# Patient Record
Sex: Male | Born: 1935 | Race: White | Hispanic: No | Marital: Married | Smoking: Never smoker
Health system: Southern US, Community
[De-identification: ages and names within clinical notes are randomized; demographics above are authoritative.]

## PROBLEM LIST (undated history)

## (undated) DIAGNOSIS — C61 Malignant neoplasm of prostate: Secondary | ICD-10-CM

## (undated) DIAGNOSIS — Z9889 Other specified postprocedural states: Secondary | ICD-10-CM

## (undated) DIAGNOSIS — H3530 Unspecified macular degeneration: Secondary | ICD-10-CM

## (undated) DIAGNOSIS — M479 Spondylosis, unspecified: Secondary | ICD-10-CM

## (undated) DIAGNOSIS — Z974 Presence of external hearing-aid: Secondary | ICD-10-CM

## (undated) DIAGNOSIS — Z9089 Acquired absence of other organs: Secondary | ICD-10-CM

## (undated) DIAGNOSIS — M316 Other giant cell arteritis: Secondary | ICD-10-CM

## (undated) DIAGNOSIS — M1990 Unspecified osteoarthritis, unspecified site: Secondary | ICD-10-CM

## (undated) HISTORY — DX: Unspecified osteoarthritis, unspecified site: M19.90

## (undated) HISTORY — DX: Other giant cell arteritis: M31.6

## (undated) HISTORY — DX: Malignant neoplasm of prostate: C61

## (undated) HISTORY — PX: TONSILLECTOMY: SUR1361

## (undated) HISTORY — DX: Presence of external hearing-aid: Z97.4

## (undated) HISTORY — DX: Other specified postprocedural states: Z98.890

## (undated) HISTORY — DX: Acquired absence of other organs: Z90.89

## (undated) HISTORY — DX: Spondylosis, unspecified: M47.9

## (undated) HISTORY — PX: OTHER SURGICAL HISTORY: SHX169

## (undated) HISTORY — DX: Unspecified macular degeneration: H35.30

---

## 2004-07-14 ENCOUNTER — Ambulatory Visit: Admission: RE | Admit: 2004-07-14 | Discharge: 2004-10-12 | Payer: Self-pay | Admitting: Radiation Oncology

## 2004-07-28 ENCOUNTER — Encounter: Admission: RE | Admit: 2004-07-28 | Discharge: 2004-07-28 | Payer: Self-pay | Admitting: Urology

## 2004-08-27 ENCOUNTER — Ambulatory Visit (HOSPITAL_BASED_OUTPATIENT_CLINIC_OR_DEPARTMENT_OTHER): Admission: RE | Admit: 2004-08-27 | Discharge: 2004-08-27 | Payer: Self-pay | Admitting: Urology

## 2004-08-27 ENCOUNTER — Ambulatory Visit (HOSPITAL_COMMUNITY): Admission: RE | Admit: 2004-08-27 | Discharge: 2004-08-27 | Payer: Self-pay | Admitting: Urology

## 2010-04-23 ENCOUNTER — Emergency Department (HOSPITAL_COMMUNITY): Admission: EM | Admit: 2010-04-23 | Discharge: 2010-04-24 | Payer: Self-pay | Admitting: Emergency Medicine

## 2010-05-22 ENCOUNTER — Encounter: Admission: RE | Admit: 2010-05-22 | Discharge: 2010-05-22 | Payer: Self-pay | Admitting: Neurology

## 2010-05-26 ENCOUNTER — Ambulatory Visit (HOSPITAL_COMMUNITY)
Admission: RE | Admit: 2010-05-26 | Discharge: 2010-05-26 | Payer: Self-pay | Source: Home / Self Care | Admitting: General Surgery

## 2010-11-06 LAB — CBC
HCT: 32.1 % — ABNORMAL LOW (ref 39.0–52.0)
Hemoglobin: 10.8 g/dL — ABNORMAL LOW (ref 13.0–17.0)
MCH: 30.2 pg (ref 26.0–34.0)
MCHC: 33.7 g/dL (ref 30.0–36.0)
MCV: 89.5 fL (ref 78.0–100.0)
Platelets: 476 10*3/uL — ABNORMAL HIGH (ref 150–400)
RBC: 3.59 MIL/uL — ABNORMAL LOW (ref 4.22–5.81)
RDW: 12.8 % (ref 11.5–15.5)
WBC: 10.6 10*3/uL — ABNORMAL HIGH (ref 4.0–10.5)

## 2010-11-06 LAB — SURGICAL PCR SCREEN
MRSA, PCR: NEGATIVE
Staphylococcus aureus: NEGATIVE

## 2010-11-07 LAB — POCT I-STAT, CHEM 8
BUN: 14 mg/dL (ref 6–23)
Calcium, Ion: 1.05 mmol/L — ABNORMAL LOW (ref 1.12–1.32)
Chloride: 100 mEq/L (ref 96–112)
Creatinine, Ser: 1.3 mg/dL (ref 0.4–1.5)
Glucose, Bld: 125 mg/dL — ABNORMAL HIGH (ref 70–99)
HCT: 38 % — ABNORMAL LOW (ref 39.0–52.0)
Hemoglobin: 12.9 g/dL — ABNORMAL LOW (ref 13.0–17.0)
Potassium: 3.9 mEq/L (ref 3.5–5.1)
Sodium: 137 mEq/L (ref 135–145)
TCO2: 31 mmol/L (ref 0–100)

## 2011-01-09 NOTE — H&P (Signed)
Huynh, Jeremiah NO.:  [id number]   MEDICAL RECORD NO.:  [id number]          PATIENT TYPE:  AMB   LOCATION:  [facility]                         FACILITY:  [facility]   PHYSICIAN:  Debroah Baller, M.D.     DATE OF BIRTH:  1935-10-23   DATE OF ADMISSION:  08/27/2004  DATE OF DISCHARGE:                                HISTORY & PHYSICAL   The patient is a 75 year old gentleman with a stage T1C adenocarcinoma of  the prostate with a Gleason PSA of 7.86.  The biopsy showed an  adenocarcinoma with a Gleason score of 6 (3+3) in 10% of the specimen  obtained from the left gland.  He underwent staging with bone scan and CT  scan which did not show any metastasis.  He has been presented various  treatment options and at this time has opted for definitive therapy with  prostate seed implantation.   PAST MEDICAL HISTORY:  Significant for hypercholesterolemia.   PAST SURGICAL HISTORY:  Tonsillectomy.   SOCIAL HISTORY:  He does not smoke.   FAMILY HISTORY:  There is no family history of prostate cancer.   ALLERGIES:  He has no known drug allergies.   MEDICATIONS:  He is taking Zocor, vitamin D, Viagra p.r.n. and __________  was started in November 2005.   REVIEW OF SYMPTOMS:  As listed in the previous chart.   PHYSICAL EXAMINATION:  GENERAL:  Well-developed, well-nourished man in no  acute distress.  LUNGS:  Clear to auscultation.  BACK:  Without CVA tenderness.  HEART:  Sounds regular rate and rhythm.  ABDOMEN:  Nontender, no inguinal hernias palpable.  GU:  The penis is uncircumcised without lesions neither along the shaft nor  urethra, both testes descended without masses.  No external scrotal lesions.  RECTAL:  Reveals a 30 g prostate, smooth with slight firmness in the center.  She does have external hemorrhoids.  EXTREMITIES:  Without calf tenderness.   LABORATORY DATA:  Pending at this time.   IMPRESSION:  Stage T1 to T2 prostate cancer.   PLAN:  The patient has  been presented treatment options and has opted for  definitive therapy with radioactive seed implantation.  Will go ahead and  get him prepared for this planned intervention.      RC/MEDQ  D:  08/26/2004  T:  08/26/2004  Job:  [id number]

## 2011-01-09 NOTE — Op Note (Signed)
[name], [name] NO.:  [id number]   MEDICAL RECORD NO.:  [id number]          PATIENT TYPE:  AMB   LOCATION:  [facility]                         FACILITY:  [facility]   PHYSICIAN:  Debroah Baller, M.D.     DATE OF BIRTH:  April 06, 1936   DATE OF PROCEDURE:  08/27/2004  DATE OF DISCHARGE:                                 OPERATIVE REPORT   PREOPERATIVE DIAGNOSIS:  Prostatic carcinoma.   POSTOPERATIVE DIAGNOSIS:  Prostatic carcinoma.   PROCEDURE:  Intraprostatic seed implantation of I 125 seeds via transrectal  ultrasound guidance into the prostate and cystoscopy.   SURGEON:  Debroah Baller, M.D.   Mammie LorenzoMolli Hazard A. Kathrynn Running, M.D.   ANESTHESIA:  General.   INDICATION FOR PROCEDURE:  The patient is a 75 year old white male with a  recent diagnosis of prostate cancer.  Presented his options, he has opted  for definitive therapy with intraprostatic seed implantation.  He is brought  in now for intervention.   PROCEDURE IN DETAIL:  The patient was brought into the operating room,  placed on the table in the supine position.  After adequate general  anesthesia was achieved, a Foley catheter was placed under sterile  conditions and the patient was placed in dorsal lithotomy using the  __________ stirrups.  The scrotum was shaved, prepped, and draped, taped up  onto the lower abdomen.  The perineum was then shaved and prepped and draped  for the performance of seed implantation.  A red rubber catheter was placed  into the rectum to release any excess air.  The transrectal ultrasound probe  was placed into the rectum and the prostate was scanned from apex to  seminale vesicles.  The previously-done ultrasound was compared to align the  images on the preplanning to the real-time prostatic ultrasound.  Once this  was done, then image capturing was performed by performing ultrasound and  placing these images into the computer program.  Once the images were fed  into the  program, then the previously-planned dosimetry study was overlaid  onto the prostate.  Appropriate needle placement was then finalized for  thorough coverage of the prostate.  Once this was finalized, the implant  procedure was begun.  Anchor needles were placed into previously determined  area.  Then a dummy needle was placed and a dummy strand place to localize  the base.  This real-time ultrasound and fluoroscopic views were then  correlated and the base was marked.  Bladder base was demarcated with  contrast in the Foley balloon.  The seed implantation was the begun. The  needles were placed from anterior to posterior in the prostate to encompass  the prostate in totality.  No portion of the prostate was left untreated.  Correlation was again performed using ultrasound and fluoroscopy for maximum  seed placement.  Once the final seeds were placed with good distribution,  the procedure was terminated.  KUB x-ray was then performed.  Ultrasound and  Foley were removed.  The patient was re-prepped and draped and flexible  cystoscopy performed.  Anterior urethra was normal, prostatic urethra 3-4 cm  in length, and the bladder showed no intravesical seeds nor spacers.  The  bladder was filled, cystoscope removed, a 16 French Foley was then placed to  temporarily drain the  bladder.  A rectal vault examination was performed and no foreign bodies  were noted.  A B&O suppository was then placed for local analgesia.  The  patient had tolerated all this well.  He was then awakened and taken to the  recovery room in good condition.      RC/MEDQ  D:  08/27/2004  T:  08/27/2004  Job:  [id number]

## 2012-11-14 ENCOUNTER — Other Ambulatory Visit: Payer: Self-pay | Admitting: Neurology

## 2012-12-20 ENCOUNTER — Ambulatory Visit (INDEPENDENT_AMBULATORY_CARE_PROVIDER_SITE_OTHER): Payer: Medicare Other | Admitting: Neurology

## 2012-12-20 ENCOUNTER — Encounter: Payer: Self-pay | Admitting: Neurology

## 2012-12-20 VITALS — BP 110/71 | HR 66 | Ht 68.5 in | Wt 164.0 lb

## 2012-12-20 DIAGNOSIS — M316 Other giant cell arteritis: Secondary | ICD-10-CM

## 2012-12-20 HISTORY — DX: Other giant cell arteritis: M31.6

## 2012-12-20 MED ORDER — PREDNISONE 1 MG PO TABS: 1.0000 mg | ORAL_TABLET | Freq: Every day | ORAL | Status: DC

## 2012-12-20 NOTE — Progress Notes (Signed)
   Reason for visit: Temporal arteritis  [name] is an 77 y.o. male  History of present illness:  [name] is a 77 year old right-handed white male with a history of temporal arteritis. The patient is currently on 7.5 mg of prednisone, and he indicates that he is doing fairly well with this. The patient has been on this dose for about 6 months. The patient indicates that he does have some soreness or achiness of the muscle, but this has been a chronic issue and it has not changed whatsoever with the reduction in the prednisone dose that has occurred in the summer of 2013. The patient denies any headaches, or jaw claudication. The patient denies any new medical issues that have come up since last seen. The patient not having double vision or alteration in visual fields.  Past Medical History  Diagnosis Date  . Arthritis     temporal  . Spondylosis     cervical   . Degeneration macular   . Prostate cancer   . Hx of tonsillectomy   . Hx of biopsy     temporal artery  . Temporal arteritis 12/20/2012    Past Surgical History  Procedure Laterality Date  . Radium seed implants for prostate cancer    . Tonsillectomy      Family History  Problem Relation Age of Onset  . Stroke Sister   . Seizures Sister   . Heart disease Brother     Social history:  reports that he has never smoked. He does not have any smokeless tobacco history on file. He reports that he does not drink alcohol or use illicit drugs.  Allergies: No Known Allergies  Medications:  No current outpatient prescriptions on file prior to visit.   No current facility-administered medications on file prior to visit.    ROS:  Out of a complete 14 system review of symptoms, the patient complains only of the following symptoms, and all other reviewed systems are negative.  Fatigue Hearing loss Achy muscles  Blood pressure 110/71, pulse 66, height 5' 8.5" (1.74 m), weight 164 lb (74.39 kg).  Physical  Exam  General: The patient is alert and cooperative at the time of the examination.  Skin: No significant peripheral edema is noted.   Neurologic Exam  Cranial nerves: Facial symmetry is present. Speech is normal, no aphasia or dysarthria is noted. Extraocular movements are full. Visual fields are full.  Motor: The patient has good strength in all 4 extremities.  Coordination: The patient has good finger-nose-finger and heel-to-shin bilaterally.  Gait and station: The patient has a normal gait. Tandem gait is normal. Romberg is negative. No drift is seen.  Reflexes: Deep tendon reflexes are symmetric.   Assessment/Plan:  1. Temporal arteritis  The patient recently had a sed rate of 13 in February 2014. The patient has done fairly well on a relatively low dose of prednisone. The patient will be reduced to 6 mg daily. If he has increased symptoms that are persistent, he will contact our office. Otherwise, the patient will followup in 6 months. We will check another sedimentation rate in about 2 months.  Jeremiah Palau MD 12/20/2012 7:39 PM  [facility] [address] [place], [place] [address]  Phone [phone] Fax [phone]

## 2013-01-25 ENCOUNTER — Other Ambulatory Visit: Payer: Self-pay

## 2013-01-25 MED ORDER — PREDNISONE 5 MG PO TABS: 5.0000 mg | ORAL_TABLET | Freq: Every day | ORAL | Status: DC

## 2013-01-25 MED ORDER — PREDNISONE 1 MG PO TABS: 1.0000 mg | ORAL_TABLET | Freq: Every day | ORAL | Status: DC

## 2013-02-19 ENCOUNTER — Telehealth: Payer: Self-pay | Admitting: Neurology

## 2013-02-19 DIAGNOSIS — M316 Other giant cell arteritis: Secondary | ICD-10-CM

## 2013-02-19 NOTE — Telephone Encounter (Signed)
I called patient. He is to come in for a sedimentation rate check.

## 2013-02-19 NOTE — Telephone Encounter (Signed)
Message copied by Stephanie Acre on Sun Feb 19, 2013 12:49 PM ------      Message from: Stephanie Acre      Created: Tue Dec 20, 2012  9:41 AM       Recheck sedimentation rate ------

## 2013-02-20 ENCOUNTER — Other Ambulatory Visit: Payer: Self-pay | Admitting: Neurology

## 2013-02-20 LAB — SEDIMENTATION RATE: Sed Rate: 24 mm/hr (ref 0–30)

## 2013-02-21 ENCOUNTER — Telehealth: Payer: Self-pay

## 2013-02-21 NOTE — Telephone Encounter (Signed)
Message copied by Doree Barthel on Tue Feb 21, 2013  9:21 AM ------      Message from: Stephanie Acre      Created: Mon Feb 20, 2013  5:21 PM       Please call the patient. The blood work results are unremarkable. Thank you.            ----- Message -----         From: [facility] Lab Results In Interface         Sent: 02/20/2013   4:38 PM           To: York Spaniel, MD                   ------

## 2013-02-21 NOTE — Telephone Encounter (Signed)
Called to give lab results. No answer, will try later.

## 2013-02-22 ENCOUNTER — Telehealth: Payer: Self-pay | Admitting: *Deleted

## 2013-02-22 NOTE — Telephone Encounter (Signed)
Spoke to spouse, gave results. Spouse requesting to know if prednisone can be reduced.

## 2013-02-22 NOTE — Telephone Encounter (Signed)
I called patient. The patient indicates that he feels well, no headaches or malaise. At this point, he is on 6 mg of prednisone daily, okay to drop the dose to 5 mg daily.

## 2013-02-22 NOTE — Telephone Encounter (Signed)
Patient calling about results °

## 2013-04-28 ENCOUNTER — Telehealth: Payer: Self-pay | Admitting: Neurology

## 2013-04-28 MED ORDER — PREDNISONE 5 MG PO TABS: 10.0000 mg | ORAL_TABLET | Freq: Every day | ORAL | Status: DC

## 2013-04-28 NOTE — Telephone Encounter (Signed)
I called the patient. His sedimentation rate has gone up to 31, barely out of the normal range. The patient however, began having some neck discomfort. His doctor put him back up to 10 mg of prednisone, which is reasonable. The patient was on 6 mg daily. We will recheck a sedimentation rate in 2 months.

## 2013-04-28 NOTE — Telephone Encounter (Signed)
Spouse reports two weeks ago patient started having pain in neck again. Went to PCP, Sed Rate results were 31 today. Was told to start Prednisone 10mg  by PCP. Spouse wanted to inform Dr. Anne Hahn of Sed Rate results and see if he thinks Prednisone should be increased.

## 2013-06-21 ENCOUNTER — Encounter: Payer: Self-pay | Admitting: Neurology

## 2013-06-21 ENCOUNTER — Encounter (INDEPENDENT_AMBULATORY_CARE_PROVIDER_SITE_OTHER): Payer: Self-pay

## 2013-06-21 ENCOUNTER — Ambulatory Visit (INDEPENDENT_AMBULATORY_CARE_PROVIDER_SITE_OTHER): Payer: Medicare Other | Admitting: Neurology

## 2013-06-21 VITALS — BP 130/78 | HR 72 | Wt 166.0 lb

## 2013-06-21 DIAGNOSIS — M316 Other giant cell arteritis: Secondary | ICD-10-CM

## 2013-06-21 DIAGNOSIS — Z5181 Encounter for therapeutic drug level monitoring: Secondary | ICD-10-CM

## 2013-06-21 LAB — SEDIMENTATION RATE: Sed Rate: 30 mm/hr (ref 0–30)

## 2013-06-21 NOTE — Progress Notes (Signed)
Reason for visit: Temporal arteritis  [name] is an 77 y.o. male  History of present illness:  [name] is a 77 year old right-handed white male with a history of temporal arteritis. The patient was tapered down to 6 mg daily of prednisone, but the sedimentation rate began to climb to 31, slightly out of the normal range. The patient began having increased discomfort in the neck, primarily on the right. The patient was increased to 10 mg daily of prednisone, and this has helped the discomfort some. The patient denied any headache issues, or jaw discomfort with chewing. The patient has not had any visual field changes. The patient does have intermittent discomfort in the neck on the right side. The patient will note crepitus in the neck. The pain is intermittent in nature. The patient denies any pain going down into the arms. The patient denies any new medical issues that have come up since last seen.  Past Medical History  Diagnosis Date  . Arthritis     temporal  . Spondylosis     cervical   . Degeneration macular   . Prostate cancer   . Hx of tonsillectomy   . Hx of biopsy     temporal artery  . Temporal arteritis 12/20/2012    Past Surgical History  Procedure Laterality Date  . Radium seed implants for prostate cancer    . Tonsillectomy      Family History  Problem Relation Age of Onset  . Stroke Sister   . Seizures Sister   . Heart disease Brother     Social history:  reports that he has never smoked. He has never used smokeless tobacco. He reports that he does not drink alcohol or use illicit drugs.   No Known Allergies  Medications:  Current Outpatient Prescriptions on File Prior to Visit  Medication Sig Dispense Refill  . aspirin 81 MG tablet Take 81 mg by mouth every other day. One tablet      . B Complex Vitamins (B COMPLEX 50) TABS Take 1 tablet by mouth daily.      . Calcium Carbonate-Vitamin D (CALTRATE 600+D PO) Take by mouth. Twice daily, chew       . cholecalciferol (VITAMIN D) 1000 UNITS tablet Take 1,000 Units by mouth 2 (two) times daily.      . predniSONE (DELTASONE) 5 MG tablet Take 2 tablets (10 mg total) by mouth daily.  180 tablet  1   No current facility-administered medications on file prior to visit.    ROS:  Out of a complete 14 system review of symptoms, the patient complains only of the following symptoms, and all other reviewed systems are negative.  Fatigue Hearing loss Neck discomfort  Blood pressure 130/78, pulse 72, weight 166 lb (75.297 kg).  Physical Exam  General: The patient is alert and cooperative at the time of the examination.  Neuromuscular: The patient lacks only about 5 or 10 of full lateral rotation of the cervical spine bilaterally.  Skin: No significant peripheral edema is noted.   Neurologic Exam  Mental status: The patient is oriented x 3.  Cranial nerves: Facial symmetry is present. Speech is normal, no aphasia or dysarthria is noted. Extraocular movements are full. Visual fields are full.  Motor: The patient has good strength in all 4 extremities.  Sensory examination: Soft touch sensation on the face, arms, and legs are symmetric.  Coordination: The patient has good finger-nose-finger and heel-to-shin bilaterally.  Gait and station: The patient  has a normal gait. Tandem gait is normal. Romberg is negative. No drift is seen.  Reflexes: Deep tendon reflexes are symmetric.   Assessment/Plan:  1. Temporal arteritis  2. Probable cervical spondylosis  The neck discomfort likely is related to cervical spondylosis, not to temporal arteritis. The sedimentation rate was rising on the lower doses of prednisone, however. The patient will be sent for a sedimentation rate today. If this is well within the normal range, the prednisone dose may be lowered slightly. The patient will followup in 6 months.  Marlan Palau MD 06/21/2013 10:08 AM  [facility] [address] [place], [place] [address]  Phone [phone] Fax [phone]

## 2013-06-21 NOTE — Patient Instructions (Signed)
Temporal Arteritis Arteries are blood vessels that carry blood from the heart to all parts of the body. Temporal arteritis is a swelling (inflammation) of certain large arteries. This usually affects arteries in the head and neck area, including arteries in the area on the side of the head, between the ears and eyes (temples). The condition can be very painful. It also can cause serious problems, even blindness. Early diagnosis and treatment is very important. CAUSES  Temporal arteritis results from the body reacting to injury or infection (inflammation). This may occur when the body's immune system (which fights germs and disease) makes a mistake. It attacks its own arteries. No one knows why this happens. However, certain things (risk factors) make it more likely that a person will develop temporal arteritis. They include:  Age. Most people with temporal arteritis are older than 50. The average age is 70.  Sex. Three times more women than men develop the condition.  Race and ethnic background. Caucasians are more likely to have temporal arteritis than other races. So are people whose families came from [place], [place], [place] or [place]).  Having polymyalgia rheumatica (PMR). This condition causes stiffness and pain in the joints of the neck, shoulders and hips. About 15% of people with PMR also have temporal arteritis. SYMPTOMS  Not everyone with temporal arteritis has the same symptoms. Some people have just one symptom. Others may have several. The most common symptom is a new headache, often in the temple region. Symptoms may show up in other parts of the body too.   Symptoms affecting the head may include:  Temporal arteries that feel hard or swollen. It may hurt when the temples are touched.  Pain when combing your hair, or when laying your head on a pillow.  Pain in the jaw when chewing.  Pain in the throat or tongue.  Visual problems, including sudden loss of  vision in one eye, or seeing double.  Symptoms in other parts of the body may include:  Fever.  Fatigue.  A dry cough.  Pain in the hips and shoulders.  Pain in the arms during exercise.  Depression.  Weight loss. DIAGNOSIS  Symptoms of temporal arteritis are similar to symptoms for other conditions. This can make it hard to tell if you have the condition. To be sure, your caregiver will ask about your symptoms and do a physical exam. Certain tests may be necessary, such as:   An exam of your temples. Often, the temporal arteries will be swollen and hard. This can be felt.  A complete blood count. This test shows how many red blood cells are in your blood. Most people with temporal arteritis do not have enough red blood cells (anemic).  Erythrocyte sedimentation (also called sed rate test). It measures inflammation in the body. Almost everyone with temporal arteritis has a high sed rate.  C reactive protein test. This also shows if there is inflammation.  Biopsy a temporal artery. This means the caregiver will take out a small piece of an artery. Then, it is checked under a microscope for inflammation. More than one biopsy may be needed. That is because inflammation can be in one part of an artery and not in others. Your caregiver may need to check more than one spot. TREATMENT  Starting treatment right away is very important. Often, you will need to see a specialist in immunologic diseases (rheumatologist). Goals of treatment include protecting your eyesight. Once vision is gone, it might not come   back. The normal treatment is medication. It usually works well and quickly. Most people start getting better in a few days. Medication options include:  Corticosteroids. These are powerful drugs that fight inflammation. These drugs are most often used to treat temporal arteritis.  Usually, a high dose is taken at first. After symptoms improve, a smaller dose is used. The goal is to take  the smallest dose possible and still control your symptoms. That is because using corticosteroids for a long time can cause problems. They can make muscles and bones weak. They can cause blood pressure to go up, and cause diabetes. Also, people often gain weight when they take corticosteroids. Corticosteroids may need to be taken for one or two years.  Several newer drugs are being tested to treat temporal arteritis. Researchers are testing to see if new drugs will work as well as corticosteroids, but cause fewer problems than them. Testing of these drugs is not yet complete.  Some specialists recommend low dose aspirin to prevent blood clots. HOME CARE INSTRUCTIONS   Take any corticosteroids that your caregiver prescribes. Follow the directions carefully.  Take any vitamins or supplements that your caregiver suggests. This may include vitamin D and calcium. They help keep your bones from becoming weak.  Keep all appointments for checkups. Your caregiver will watch for any problems from the medication. Checkups may include:  Periodic blood tests.  Bone density testing. This checks how strong or weak your bones are.  Blood pressure checks. If your blood pressure rises, you may need to take a drug to control it while you are taking corticosteroids.  Blood sugar checks. This is to be sure you are not developing diabetes. If you have diabetes, corticosteroid medications may make it worse and require increased treatment.  Exercise. First, talk with your caregiver about what would be OK for you to do. Aerobic exercise (which increases your heart rate) is usually suggested. It does not have to require a lot of energy. Walking is aerobic exercise. This type of exercise is good because it helps prevent bone loss. It also helps control your blood pressure.  Follow a healthy diet. The goal is to prevent bone damage and diabetes. Include good sources of protein in your diet. Also, include fruits,  vegetables and whole grains. Your caregiver can refer you to an expert on healthy eating (dietitian) for more advice. SEEK MEDICAL CARE IF:   The symptoms that lead to your diagnosis return.  You develop worsening fever, fatigue, headache, weight loss, or pain in your jaw.  You develop signs of infection. Infections can be worse if you are on corticosteroid medication. SEEK IMMEDIATE MEDICAL CARE IF:   Your eyesight changes.  Pain does not go away, even after taking pain medicine.  You feel pain in your chest.  Breathing is difficult.  One side of your face or body suddenly becomes weak or numb.  You develop a fever of more than 102 F (38.9 C). Document Released: 06/07/2009 Document Revised: 11/02/2011 Document Reviewed: 06/07/2009 [employer] Patient Information 2014 [employer].  

## 2013-06-22 NOTE — Progress Notes (Signed)
Quick Note:  Shared unremarkable results with wife(Sylvia), she understood ______

## 2013-10-04 ENCOUNTER — Telehealth: Payer: Self-pay | Admitting: Neurology

## 2013-10-04 DIAGNOSIS — Z5181 Encounter for therapeutic drug level monitoring: Secondary | ICD-10-CM

## 2013-10-04 NOTE — Telephone Encounter (Signed)
I called patient. The last sedimentation rate was done in October 2014. We will check one in March before his April appointment. I called and talked with the wife.

## 2013-10-04 NOTE — Telephone Encounter (Signed)
Pt's wife called and asked if the patient will need to have his SED rate checked before their appointment on 12-20-13.  Please call.  Thank you

## 2013-10-04 NOTE — Addendum Note (Signed)
Addended by: Margette Fast on: 10/04/2013 06:22 PM   Modules accepted: Orders

## 2013-10-23 ENCOUNTER — Encounter (INDEPENDENT_AMBULATORY_CARE_PROVIDER_SITE_OTHER): Payer: Self-pay

## 2013-10-23 ENCOUNTER — Other Ambulatory Visit (INDEPENDENT_AMBULATORY_CARE_PROVIDER_SITE_OTHER): Payer: Self-pay

## 2013-10-23 DIAGNOSIS — Z0289 Encounter for other administrative examinations: Secondary | ICD-10-CM

## 2013-10-23 DIAGNOSIS — Z5181 Encounter for therapeutic drug level monitoring: Secondary | ICD-10-CM

## 2013-10-24 LAB — SEDIMENTATION RATE: Sed Rate: 25 mm/hr (ref 0–30)

## 2013-10-24 NOTE — Progress Notes (Signed)
Quick Note:  Shared normal ESR per Dr Tobey Grim findings and patient verbalized understanding ______

## 2013-10-26 ENCOUNTER — Other Ambulatory Visit: Payer: Self-pay | Admitting: Neurology

## 2013-12-20 ENCOUNTER — Encounter: Payer: Self-pay | Admitting: Neurology

## 2013-12-20 ENCOUNTER — Ambulatory Visit (INDEPENDENT_AMBULATORY_CARE_PROVIDER_SITE_OTHER): Payer: Medicare Other | Admitting: Neurology

## 2013-12-20 VITALS — BP 122/78 | HR 60 | Wt 165.0 lb

## 2013-12-20 DIAGNOSIS — M316 Other giant cell arteritis: Secondary | ICD-10-CM

## 2013-12-20 MED ORDER — PREDNISONE 1 MG PO TABS: ORAL_TABLET | ORAL | Status: DC

## 2013-12-20 MED ORDER — PREDNISONE 5 MG PO TABS: 5.0000 mg | ORAL_TABLET | Freq: Every day | ORAL | Status: DC

## 2013-12-20 NOTE — Progress Notes (Signed)
PATIENT: [name] DOB: January 24, 1936  REASON FOR VISIT: follow up HISTORY FROM: patient  HISTORY OF PRESENT ILLNESS: [name] is a 78 year old right-handed white male with a history of temporal arteritis. He is returning today for follow-up. The patient is currently on 10 mg of prednisone, and he indicates that he is doing well with this. His last ESR was in March and it was 25. The patient's headaches and neck discomfort have resolved. He had injections in both eyes yesterday for macular degeneration. No new medical issues since last visit.    REVIEW OF SYSTEMS: Full 14 system review of systems performed and notable only for:  Constitutional: N/A  Eyes: loss of vision Ear/Nose/Throat: hearing loss Skin: N/A  Cardiovascular: N/A  Respiratory: N/A  Gastrointestinal: N/A  Genitourinary: N/A Hematology/Lymphatic: N/A  Endocrine: N/A Musculoskeletal:N/A  Allergy/Immunology: N/A  Neurological: N/A Psychiatric: N/A Sleep: N/A   ALLERGIES: No Known Allergies  HOME MEDICATIONS: Outpatient Prescriptions Prior to Visit  Medication Sig Dispense Refill  . aspirin 81 MG tablet Take 81 mg by mouth every other day. One tablet      . B Complex Vitamins (B COMPLEX 50) TABS Take 1 tablet by mouth daily.      . Calcium Carbonate-Vitamin D (CALTRATE 600+D PO) Take by mouth. Twice daily, chew      . cholecalciferol (VITAMIN D) 1000 UNITS tablet Take 1,000 Units by mouth 2 (two) times daily.      . predniSONE (DELTASONE) 5 MG tablet TAKE 2 TABLETS EVERY DAY  180 tablet  0   No facility-administered medications prior to visit.    PAST MEDICAL HISTORY: Past Medical History  Diagnosis Date  . Arthritis     temporal  . Spondylosis     cervical   . Degeneration macular   . Prostate cancer   . Hx of tonsillectomy   . Hx of biopsy     temporal artery  . Temporal arteritis 12/20/2012  . Hearing aid worn     Right    PAST SURGICAL HISTORY: Past Surgical History  Procedure  Laterality Date  . Radium seed implants for prostate cancer    . Tonsillectomy      FAMILY HISTORY: Family History  Problem Relation Age of Onset  . Stroke Sister   . Seizures Sister   . Heart disease Brother     SOCIAL HISTORY: History   Social History  . Marital Status: Married    Spouse Name: N/A    Number of Children: 2  . Years of Education: hs   Occupational History  . Not on file.   Social History Main Topics  . Smoking status: Never Smoker   . Smokeless tobacco: Never Used  . Alcohol Use: No  . Drug Use: No  . Sexual Activity: Not on file   Other Topics Concern  . Not on file   Social History Narrative  . No narrative on file      PHYSICAL EXAM  There were no vitals filed for this visit. There is no weight on file to calculate BMI.  Generalized: Well developed, in no acute distress  Head: normocephalic and atraumatic. No tenderness on scalp or temporal region.  Neurological examination  Mentation: Alert oriented to time, place, history taking. Follows all commands speech and language fluent Cranial nerve II-XII: Pupils were equal round reactive to light extraocular movements were full, visual field were full on confrontational test. Motor: The motor testing reveals 5 over 5 strength of  all 4 extremities. Good symmetric motor tone is noted throughout.  Coordination: Cerebellar testing reveals good finger-nose-finger and heel-to-shin bilaterally.  Gait and station: Gait is normal. Tandem gait is normal. Romberg is negative. No drift is seen.  Reflexes: Deep tendon reflexes are symmetric and normal bilaterally.   DIAGNOSTIC DATA (LABS, IMAGING, TESTING) - I reviewed patient records, labs, notes, testing and imaging myself where available.  Lab Results  Component Value Date   WBC 10.6* 05/22/2010   HGB 10.8* 05/22/2010   HCT 32.1* 05/22/2010   MCV 89.5 05/22/2010   PLT 476* 05/22/2010      Component Value Date/Time   NA 137 04/23/2010 1950   K 3.9  04/23/2010 1950   CL 100 04/23/2010 1950   GLUCOSE 125* 04/23/2010 1950   BUN 14 04/23/2010 1950   CREATININE 1.3 04/23/2010 1950     ASSESSMENT AND PLAN 78 y.o. year old male  has a past medical history of Arthritis; Spondylosis; Degeneration macular; Prostate cancer; tonsillectomy; biopsy; Temporal arteritis (12/20/2012); and Hearing aid worn. here with   1. Temporal arteritis  The patient's headaches and neck discomfort have resolved  Will decrease Prednisone to 9 mg for 6 weeks then 8 mg until his next visit. Follow up 3-4 months or sooner if needed.    Ward Givens, MSN, NP-C 12/20/2013, 9:58 AM [facility] [address] [place], [place] [address] [phone]  Note: This document was prepared with digital dictation and possible smart phrase technology. Any transcriptional errors that result from this process are unintentional. Kathrynn Ducking

## 2013-12-20 NOTE — Patient Instructions (Addendum)
Decrease prednisone to 9 mg for 6 weeks then 8 mg for 6 weeks. Temporal Arteritis Arteries are blood vessels that carry blood from the heart to all parts of the body. Temporal arteritis is a swelling (inflammation) of certain large arteries. This usually affects arteries in the head and neck area, including arteries in the area on the side of the head, between the ears and eyes (temples). The condition can be very painful. It also can cause serious problems, even blindness. Early diagnosis and treatment is very important. CAUSES  Temporal arteritis results from the body reacting to injury or infection (inflammation). This may occur when the body's immune system (which fights germs and disease) makes a mistake. It attacks its own arteries. No one knows why this happens. However, certain things (risk factors) make it more likely that a person will develop temporal arteritis. They include:  Age. Most people with temporal arteritis are older than 50. The average age is 13.  Sex. Three times more women than men develop the condition.  Race and ethnic background. Caucasians are more likely to have temporal arteritis than other races. So are people whose families came from [place] or [place]  Having polymyalgia rheumatica (PMR). This condition causes stiffness and pain in the joints of the neck, shoulders and hips. About 15% of people with PMR also have temporal arteritis. SYMPTOMS  Not everyone with temporal arteritis has the same symptoms. Some people have just one symptom. Others may have several. The most common symptom is a new headache, often in the temple region. Symptoms may show up in other parts of the body too.   Symptoms affecting the head may include:  Temporal arteries that feel hard or swollen. It may hurt when the temples are touched.  Pain when combing your hair, or when laying your head on a pillow.  Pain in the jaw when chewing.  Pain in the  throat or tongue.  Visual problems, including sudden loss of vision in one eye, or seeing double.  Symptoms in other parts of the body may include:  Fever.  Fatigue.  A dry cough.  Pain in the hips and shoulders.  Pain in the arms during exercise.  Depression.  Weight loss. DIAGNOSIS  Symptoms of temporal arteritis are similar to symptoms for other conditions. This can make it hard to tell if you have the condition. To be sure, your caregiver will ask about your symptoms and do a physical exam. Certain tests may be necessary, such as:   An exam of your temples. Often, the temporal arteries will be swollen and hard. This can be felt.  A complete blood count. This test shows how many red blood cells are in your blood. Most people with temporal arteritis do not have enough red blood cells (anemic).  Erythrocyte sedimentation (also called sed rate test). It measures inflammation in the body. Almost everyone with temporal arteritis has a high sed rate.  C reactive protein test. This also shows if there is inflammation.  Biopsy a temporal artery. This means the caregiver will take out a small piece of an artery. Then, it is checked under a microscope for inflammation. More than one biopsy may be needed. That is because inflammation can be in one part of an artery and not in others. Your caregiver may need to check more than one spot. TREATMENT  Starting treatment right away is very important. Often, you will need to see a specialist in immunologic diseases (rheumatologist). Goals  of treatment include protecting your eyesight. Once vision is gone, it might not come back. The normal treatment is medication. It usually works well and quickly. Most people start getting better in a few days. Medication options include:  Corticosteroids. These are powerful drugs that fight inflammation. These drugs are most often used to treat temporal arteritis.  Usually, a high dose is taken at first. After  symptoms improve, a smaller dose is used. The goal is to take the smallest dose possible and still control your symptoms. That is because using corticosteroids for a long time can cause problems. They can make muscles and bones weak. They can cause blood pressure to go up, and cause diabetes. Also, people often gain weight when they take corticosteroids. Corticosteroids may need to be taken for one or two years.  Several newer drugs are being tested to treat temporal arteritis. Researchers are testing to see if new drugs will work as well as corticosteroids, but cause fewer problems than them. Testing of these drugs is not yet complete.  Some specialists recommend low dose aspirin to prevent blood clots. HOME CARE INSTRUCTIONS   Take any corticosteroids that your caregiver prescribes. Follow the directions carefully.  Take any vitamins or supplements that your caregiver suggests. This may include vitamin D and calcium. They help keep your bones from becoming weak.  Keep all appointments for checkups. Your caregiver will watch for any problems from the medication. Checkups may include:  Periodic blood tests.  Bone density testing. This checks how strong or weak your bones are.  Blood pressure checks. If your blood pressure rises, you may need to take a drug to control it while you are taking corticosteroids.  Blood sugar checks. This is to be sure you are not developing diabetes. If you have diabetes, corticosteroid medications may make it worse and require increased treatment.  Exercise. First, talk with your caregiver about what would be OK for you to do. Aerobic exercise (which increases your heart rate) is usually suggested. It does not have to require a lot of energy. Walking is aerobic exercise. This type of exercise is good because it helps prevent bone loss. It also helps control your blood pressure.  Follow a healthy diet. The goal is to prevent bone damage and diabetes. Include good  sources of protein in your diet. Also, include fruits, vegetables and whole grains. Your caregiver can refer you to an expert on healthy eating (dietitian) for more advice. SEEK MEDICAL CARE IF:   The symptoms that lead to your diagnosis return.  You develop worsening fever, fatigue, headache, weight loss, or pain in your jaw.  You develop signs of infection. Infections can be worse if you are on corticosteroid medication. SEEK IMMEDIATE MEDICAL CARE IF:   Your eyesight changes.  Pain does not go away, even after taking pain medicine.  You feel pain in your chest.  Breathing is difficult.  One side of your face or body suddenly becomes weak or numb.  You develop a fever of more than 102 F (38.9 C). Document Released: 06/07/2009 Document Revised: 11/02/2011 Document Reviewed: 06/07/2009 [facility] Patient Information 2014 [place], [place].

## 2014-01-26 ENCOUNTER — Other Ambulatory Visit: Payer: Self-pay | Admitting: Neurology

## 2014-01-26 MED ORDER — PREDNISONE 1 MG PO TABS: ORAL_TABLET | ORAL | Status: DC

## 2014-01-26 NOTE — Telephone Encounter (Signed)
Patients dose has decreased per last OV: The patient's headaches and neck discomfort have resolved  Will decrease Prednisone to 9 mg for 6 weeks then 8 mg until his next visit.  Follow up 3-4 months or sooner if needed.

## 2014-03-19 ENCOUNTER — Telehealth: Payer: Self-pay | Admitting: Neurology

## 2014-03-19 DIAGNOSIS — M316 Other giant cell arteritis: Secondary | ICD-10-CM

## 2014-03-19 NOTE — Telephone Encounter (Signed)
Patient's wife is calling--states patient is trying to reduce the amount of Prednisone--he has been taking 8mg  for 6 to 7 weeks and wants to know if he should come in for a sed rate--please call patient and advise--thank you.

## 2014-03-19 NOTE — Telephone Encounter (Signed)
I called patient. I had a computer reminder to call them 2 days from now to get the sedimentation rate. I will go ahead and put the order in for this.

## 2014-03-20 ENCOUNTER — Other Ambulatory Visit (INDEPENDENT_AMBULATORY_CARE_PROVIDER_SITE_OTHER): Payer: Self-pay

## 2014-03-20 ENCOUNTER — Telehealth: Payer: Self-pay | Admitting: Neurology

## 2014-03-20 DIAGNOSIS — M316 Other giant cell arteritis: Secondary | ICD-10-CM

## 2014-03-20 DIAGNOSIS — Z0289 Encounter for other administrative examinations: Secondary | ICD-10-CM

## 2014-03-20 LAB — SEDIMENTATION RATE: Sed Rate: 6 mm/hr (ref 0–30)

## 2014-03-20 MED ORDER — PREDNISONE 1 MG PO TABS: ORAL_TABLET | ORAL | Status: DC

## 2014-03-20 NOTE — Telephone Encounter (Signed)
I called patient. The sedimentation rate was 6. The patient is on 8 mg daily of prednisone, we will drop the dose to 7 mg a day for one month, and then go to 6 mg daily. The patient has an appointment at the end of October 2015.

## 2014-05-09 ENCOUNTER — Ambulatory Visit: Payer: Medicare Other | Admitting: Neurology

## 2014-05-14 ENCOUNTER — Ambulatory Visit (INDEPENDENT_AMBULATORY_CARE_PROVIDER_SITE_OTHER): Payer: Medicare Other | Admitting: Neurology

## 2014-05-14 ENCOUNTER — Encounter: Payer: Self-pay | Admitting: Neurology

## 2014-05-14 VITALS — BP 131/77 | HR 74 | Wt 168.0 lb

## 2014-05-14 DIAGNOSIS — M316 Other giant cell arteritis: Secondary | ICD-10-CM

## 2014-05-14 MED ORDER — PREDNISONE 5 MG PO TABS: 5.0000 mg | ORAL_TABLET | Freq: Every day | ORAL | Status: DC

## 2014-05-14 MED ORDER — PREDNISONE 1 MG PO TABS: ORAL_TABLET | ORAL | Status: DC

## 2014-05-14 NOTE — Progress Notes (Signed)
Reason for visit: Temporal arteritis  [name] is an 78 y.o. male  History of present illness:  [name] is a 78 year old right-handed white male with a history of temporal arteritis. The patient has been on a tapering dose of prednisone, and he continues to do well, without headache or malaise. The patient is on 6 mg daily of prednisone currently. The patient had a recent sedimentation rate of 6. He denies any other significant medical issues, but he currently is followed for macular edema affecting both eyes, and he has required injections into the macula. The patient returns for an evaluation.  Past Medical History  Diagnosis Date  . Arthritis     temporal  . Spondylosis     cervical   . Degeneration macular   . Prostate cancer   . Hx of tonsillectomy   . Hx of biopsy     temporal artery  . Temporal arteritis 12/20/2012  . Hearing aid worn     Right    Past Surgical History  Procedure Laterality Date  . Radium seed implants for prostate cancer    . Tonsillectomy      Family History  Problem Relation Age of Onset  . Stroke Sister   . Seizures Sister   . Heart disease Brother     Social history:  reports that he has never smoked. He has never used smokeless tobacco. He reports that he does not drink alcohol or use illicit drugs.   No Known Allergies  Medications:  Current Outpatient Prescriptions on File Prior to Visit  Medication Sig Dispense Refill  . aspirin 81 MG tablet Take 81 mg by mouth every other day. One tablet      . B Complex Vitamins (B COMPLEX 50) TABS Take 1 tablet by mouth daily.      . Calcium Carbonate-Vitamin D (CALTRATE 600+D PO) Take by mouth. Twice daily, chew      . cholecalciferol (VITAMIN D) 1000 UNITS tablet Take 1,000 Units by mouth 2 (two) times daily.       No current facility-administered medications on file prior to visit.    ROS:  Out of a complete 14 system review of symptoms, the patient complains only of the  following symptoms, and all other reviewed systems are negative.  Hearing loss Visual loss  Blood pressure 131/77, pulse 74, weight 168 lb (76.204 kg).  Physical Exam  General: The patient is alert and cooperative at the time of the examination.  Skin: No significant peripheral edema is noted.   Neurologic Exam  Mental status: The patient is oriented x 3.  Cranial nerves: Facial symmetry is present. Speech is normal, no aphasia or dysarthria is noted. Extraocular movements are full. Visual fields are full. Pupils are equal, round, and reactive to light. Discs are flat bilaterally.  Motor: The patient has good strength in all 4 extremities.  Sensory examination: Soft touch sensation is symmetric on the face, arms, and legs.  Coordination: The patient has good finger-nose-finger and heel-to-shin bilaterally.  Gait and station: The patient has a normal gait. Tandem gait is normal. Romberg is negative. No drift is seen.  Reflexes: Deep tendon reflexes are symmetric.   Assessment/Plan:  1. Temporal arteritis  The patient continues to do well on the prednisone taper. We will drop down to 5 mg daily for 6 weeks, then go to 4 mg daily for 6 weeks, then we will check a sedimentation rate. The patient will followup in 6 months.  Prescriptions were given for the prednisone.  Jeremiah Alexanders MD 05/14/2014 7:23 PM  [facility] [address] [place], [place] [address]  Phone [phone] Fax [phone]

## 2014-05-14 NOTE — Patient Instructions (Signed)
Go to 5 mg daily of the prednisone for 6 weeks, then go to 4 mg daily for 6 weeks, then we will check a sedimentation rate ( blood work )  Temporal Arteritis Arteries are blood vessels that carry blood from the heart to all parts of the body. Temporal arteritis is a swelling (inflammation) of certain large arteries. This usually affects arteries in the head and neck area, including arteries in the area on the side of the head, between the ears and eyes (temples). The condition can be very painful. It also can cause serious problems, even blindness. Early diagnosis and treatment is very important. CAUSES  Temporal arteritis results from the body reacting to injury or infection (inflammation). This may occur when the body's immune system (which fights germs and disease) makes a mistake. It attacks its own arteries. No one knows why this happens. However, certain things (risk factors) make it more likely that a person will develop temporal arteritis. They include:  Age. Most people with temporal arteritis are older than 50. The average age is 58.  Sex. Three times more women than men develop the condition.  Race and ethnic background. Caucasians are more likely to have temporal arteritis than other races. So are people whose families came from [place], [place] or [place]  Having polymyalgia rheumatica (PMR). This condition causes stiffness and pain in the joints of the neck, shoulders and hips. About 15% of people with PMR also have temporal arteritis. SYMPTOMS  Not everyone with temporal arteritis has the same symptoms. Some people have just one symptom. Others may have several. The most common symptom is a new headache, often in the temple region. Symptoms may show up in other parts of the body too.   Symptoms affecting the head may include:  Temporal arteries that feel hard or swollen. It may hurt when the temples are touched.  Pain when combing your hair, or  when laying your head on a pillow.  Pain in the jaw when chewing.  Pain in the throat or tongue.  Visual problems, including sudden loss of vision in one eye, or seeing double.  Symptoms in other parts of the body may include:  Fever.  Fatigue.  A dry cough.  Pain in the hips and shoulders.  Pain in the arms during exercise.  Depression.  Weight loss. DIAGNOSIS  Symptoms of temporal arteritis are similar to symptoms for other conditions. This can make it hard to tell if you have the condition. To be sure, your caregiver will ask about your symptoms and do a physical exam. Certain tests may be necessary, such as:   An exam of your temples. Often, the temporal arteries will be swollen and hard. This can be felt.  A complete blood count. This test shows how many red blood cells are in your blood. Most people with temporal arteritis do not have enough red blood cells (anemic).  Erythrocyte sedimentation (also called sed rate test). It measures inflammation in the body. Almost everyone with temporal arteritis has a high sed rate.  C reactive protein test. This also shows if there is inflammation.  Biopsy a temporal artery. This means the caregiver will take out a small piece of an artery. Then, it is checked under a microscope for inflammation. More than one biopsy may be needed. That is because inflammation can be in one part of an artery and not in others. Your caregiver may need to check more than one spot. TREATMENT  Starting  treatment right away is very important. Often, you will need to see a specialist in immunologic diseases (rheumatologist). Goals of treatment include protecting your eyesight. Once vision is gone, it might not come back. The normal treatment is medication. It usually works well and quickly. Most people start getting better in a few days. Medication options include:  Corticosteroids. These are powerful drugs that fight inflammation. These drugs are most often  used to treat temporal arteritis.  Usually, a high dose is taken at first. After symptoms improve, a smaller dose is used. The goal is to take the smallest dose possible and still control your symptoms. That is because using corticosteroids for a long time can cause problems. They can make muscles and bones weak. They can cause blood pressure to go up, and cause diabetes. Also, people often gain weight when they take corticosteroids. Corticosteroids may need to be taken for one or two years.  Several newer drugs are being tested to treat temporal arteritis. Researchers are testing to see if new drugs will work as well as corticosteroids, but cause fewer problems than them. Testing of these drugs is not yet complete.  Some specialists recommend low dose aspirin to prevent blood clots. HOME CARE INSTRUCTIONS   Take any corticosteroids that your caregiver prescribes. Follow the directions carefully.  Take any vitamins or supplements that your caregiver suggests. This may include vitamin D and calcium. They help keep your bones from becoming weak.  Keep all appointments for checkups. Your caregiver will watch for any problems from the medication. Checkups may include:  Periodic blood tests.  Bone density testing. This checks how strong or weak your bones are.  Blood pressure checks. If your blood pressure rises, you may need to take a drug to control it while you are taking corticosteroids.  Blood sugar checks. This is to be sure you are not developing diabetes. If you have diabetes, corticosteroid medications may make it worse and require increased treatment.  Exercise. First, talk with your caregiver about what would be OK for you to do. Aerobic exercise (which increases your heart rate) is usually suggested. It does not have to require a lot of energy. Walking is aerobic exercise. This type of exercise is good because it helps prevent bone loss. It also helps control your blood  pressure.  Follow a healthy diet. The goal is to prevent bone damage and diabetes. Include good sources of protein in your diet. Also, include fruits, vegetables and whole grains. Your caregiver can refer you to an expert on healthy eating (dietitian) for more advice. SEEK MEDICAL CARE IF:   The symptoms that lead to your diagnosis return.  You develop worsening fever, fatigue, headache, weight loss, or pain in your jaw.  You develop signs of infection. Infections can be worse if you are on corticosteroid medication. SEEK IMMEDIATE MEDICAL CARE IF:   Your eyesight changes.  Pain does not go away, even after taking pain medicine.  You feel pain in your chest.  Breathing is difficult.  One side of your face or body suddenly becomes weak or numb.  You develop a fever of more than 102 F (38.9 C). Document Released: 06/07/2009 Document Revised: 11/02/2011 Document Reviewed: 10/04/2013 [facility] Patient Information 2015 [place], [place]. This information is not intended to replace advice given to you by your health care provider. Make sure you discuss any questions you have with your health care provider.

## 2014-06-19 ENCOUNTER — Ambulatory Visit: Payer: Medicare Other | Admitting: Neurology

## 2014-07-11 ENCOUNTER — Encounter: Payer: Self-pay | Admitting: Neurology

## 2014-07-17 ENCOUNTER — Encounter: Payer: Self-pay | Admitting: Neurology

## 2014-08-07 ENCOUNTER — Telehealth: Payer: Self-pay | Admitting: Neurology

## 2014-08-07 DIAGNOSIS — M316 Other giant cell arteritis: Secondary | ICD-10-CM

## 2014-08-07 NOTE — Telephone Encounter (Signed)
I called the patient. I have reminded her to call them about getting a sedimentation rate to follow-up as we have had a prednisone taper over time. I will put in orders for the sedimentation rate to be done.

## 2014-08-08 ENCOUNTER — Other Ambulatory Visit (INDEPENDENT_AMBULATORY_CARE_PROVIDER_SITE_OTHER): Payer: Self-pay

## 2014-08-08 DIAGNOSIS — M316 Other giant cell arteritis: Secondary | ICD-10-CM

## 2014-08-08 DIAGNOSIS — Z0289 Encounter for other administrative examinations: Secondary | ICD-10-CM

## 2014-08-09 ENCOUNTER — Telehealth: Payer: Self-pay | Admitting: Neurology

## 2014-08-09 LAB — SEDIMENTATION RATE: Sed Rate: 17 mm/hr (ref 0–30)

## 2014-08-09 NOTE — Telephone Encounter (Signed)
I called patient. The blood work shows a normal sedimentation rate of 17. We will drop the prednisone dose down to 3 mg daily.

## 2014-09-06 DIAGNOSIS — H3532 Exudative age-related macular degeneration: Secondary | ICD-10-CM | POA: Diagnosis not present

## 2014-09-19 ENCOUNTER — Telehealth: Payer: Self-pay | Admitting: Neurology

## 2014-09-19 NOTE — Telephone Encounter (Signed)
I called the patient. The patient is doing well on the 3 mg dose of prednisone. Recent sedimentation rate was 17. We will drop the dose to 2 mg prednisone daily, we will see the patient in March. We'll recheck a sedimentation rate at that time.

## 2014-09-19 NOTE — Telephone Encounter (Signed)
Patient's wife is calling regarding patient. Should patient change dosage for Prednisone and reduce down 1 more mg. Patient is down to Prednisone 3mg  now. Please call and advise. Thank you.

## 2014-10-25 DIAGNOSIS — Z125 Encounter for screening for malignant neoplasm of prostate: Secondary | ICD-10-CM | POA: Diagnosis not present

## 2014-10-25 DIAGNOSIS — Z79899 Other long term (current) drug therapy: Secondary | ICD-10-CM | POA: Diagnosis not present

## 2014-10-25 DIAGNOSIS — Z9181 History of falling: Secondary | ICD-10-CM | POA: Diagnosis not present

## 2014-10-25 DIAGNOSIS — Z0001 Encounter for general adult medical examination with abnormal findings: Secondary | ICD-10-CM | POA: Diagnosis not present

## 2014-10-25 DIAGNOSIS — E780 Pure hypercholesterolemia: Secondary | ICD-10-CM | POA: Diagnosis not present

## 2014-11-12 ENCOUNTER — Ambulatory Visit (INDEPENDENT_AMBULATORY_CARE_PROVIDER_SITE_OTHER): Payer: Medicare Other | Admitting: Neurology

## 2014-11-12 ENCOUNTER — Encounter: Payer: Self-pay | Admitting: Neurology

## 2014-11-12 VITALS — BP 125/72 | HR 72 | Ht 67.0 in | Wt 162.6 lb

## 2014-11-12 DIAGNOSIS — M316 Other giant cell arteritis: Secondary | ICD-10-CM | POA: Diagnosis not present

## 2014-11-12 MED ORDER — PREDNISONE 1 MG PO TABS: ORAL_TABLET | ORAL | Status: DC

## 2014-11-12 NOTE — Patient Instructions (Signed)
Temporal Arteritis Arteries are blood vessels that carry blood from the heart to all parts of the body. Temporal arteritis is a swelling (inflammation) of certain large arteries. This usually affects arteries in the head and neck area, including arteries in the area on the side of the head, between the ears and eyes (temples). The condition can be very painful. It also can cause serious problems, even blindness. Early diagnosis and treatment is very important. CAUSES  Temporal arteritis results from the body reacting to injury or infection (inflammation). This may occur when the body's immune system (which fights germs and disease) makes a mistake. It attacks its own arteries. No one knows why this happens. However, certain things (risk factors) make it more likely that a person will develop temporal arteritis. They include:  Age. Most people with temporal arteritis are older than 50. The average age is 70.  Sex. Three times more women than men develop the condition.  Race and ethnic background. Caucasians are more likely to have temporal arteritis than other races. So are people whose families came from [place] ([place], [place], [place] or [place]).  Having polymyalgia rheumatica (PMR). This condition causes stiffness and pain in the joints of the neck, shoulders and hips. About 15% of people with PMR also have temporal arteritis. SYMPTOMS  Not everyone with temporal arteritis has the same symptoms. Some people have just one symptom. Others may have several. The most common symptom is a new headache, often in the temple region. Symptoms may show up in other parts of the body too.   Symptoms affecting the head may include:  Temporal arteries that feel hard or swollen. It may hurt when the temples are touched.  Pain when combing your hair, or when laying your head on a pillow.  Pain in the jaw when chewing.  Pain in the throat or tongue.  Visual problems, including sudden loss of  vision in one eye, or seeing double.  Symptoms in other parts of the body may include:  Fever.  Fatigue.  A dry cough.  Pain in the hips and shoulders.  Pain in the arms during exercise.  Depression.  Weight loss. DIAGNOSIS  Symptoms of temporal arteritis are similar to symptoms for other conditions. This can make it hard to tell if you have the condition. To be sure, your caregiver will ask about your symptoms and do a physical exam. Certain tests may be necessary, such as:   An exam of your temples. Often, the temporal arteries will be swollen and hard. This can be felt.  A complete blood count. This test shows how many red blood cells are in your blood. Most people with temporal arteritis do not have enough red blood cells (anemic).  Erythrocyte sedimentation (also called sed rate test). It measures inflammation in the body. Almost everyone with temporal arteritis has a high sed rate.  C reactive protein test. This also shows if there is inflammation.  Biopsy a temporal artery. This means the caregiver will take out a small piece of an artery. Then, it is checked under a microscope for inflammation. More than one biopsy may be needed. That is because inflammation can be in one part of an artery and not in others. Your caregiver may need to check more than one spot. TREATMENT  Starting treatment right away is very important. Often, you will need to see a specialist in immunologic diseases (rheumatologist). Goals of treatment include protecting your eyesight. Once vision is gone, it might not come   back. The normal treatment is medication. It usually works well and quickly. Most people start getting better in a few days. Medication options include:  Corticosteroids. These are powerful drugs that fight inflammation. These drugs are most often used to treat temporal arteritis.  Usually, a high dose is taken at first. After symptoms improve, a smaller dose is used. The goal is to take  the smallest dose possible and still control your symptoms. That is because using corticosteroids for a long time can cause problems. They can make muscles and bones weak. They can cause blood pressure to go up, and cause diabetes. Also, people often gain weight when they take corticosteroids. Corticosteroids may need to be taken for one or two years.  Several newer drugs are being tested to treat temporal arteritis. Researchers are testing to see if new drugs will work as well as corticosteroids, but cause fewer problems than them. Testing of these drugs is not yet complete.  Some specialists recommend low dose aspirin to prevent blood clots. HOME CARE INSTRUCTIONS   Take any corticosteroids that your caregiver prescribes. Follow the directions carefully.  Take any vitamins or supplements that your caregiver suggests. This may include vitamin D and calcium. They help keep your bones from becoming weak.  Keep all appointments for checkups. Your caregiver will watch for any problems from the medication. Checkups may include:  Periodic blood tests.  Bone density testing. This checks how strong or weak your bones are.  Blood pressure checks. If your blood pressure rises, you may need to take a drug to control it while you are taking corticosteroids.  Blood sugar checks. This is to be sure you are not developing diabetes. If you have diabetes, corticosteroid medications may make it worse and require increased treatment.  Exercise. First, talk with your caregiver about what would be OK for you to do. Aerobic exercise (which increases your heart rate) is usually suggested. It does not have to require a lot of energy. Walking is aerobic exercise. This type of exercise is good because it helps prevent bone loss. It also helps control your blood pressure.  Follow a healthy diet. The goal is to prevent bone damage and diabetes. Include good sources of protein in your diet. Also, include fruits,  vegetables and whole grains. Your caregiver can refer you to an expert on healthy eating (dietitian) for more advice. SEEK MEDICAL CARE IF:   The symptoms that lead to your diagnosis return.  You develop worsening fever, fatigue, headache, weight loss, or pain in your jaw.  You develop signs of infection. Infections can be worse if you are on corticosteroid medication. SEEK IMMEDIATE MEDICAL CARE IF:   Your eyesight changes.  Pain does not go away, even after taking pain medicine.  You feel pain in your chest.  Breathing is difficult.  One side of your face or body suddenly becomes weak or numb.  You develop a fever of more than 102 F (38.9 C). Document Released: 06/07/2009 Document Revised: 11/02/2011 Document Reviewed: 10/04/2013 ExitCare Patient Information 2015 [employer]. This information is not intended to replace advice given to you by your health care provider. Make sure you discuss any questions you have with your health care provider.  

## 2014-11-12 NOTE — Progress Notes (Signed)
Reason for visit: Temporal arteritis  [name] is an 79 y.o. male  History of present illness:  [name] is a 79 year old right-handed white male with a history of temporal arteritis. The patient has done very well as he has tapered down off of the prednisone. The patient is on 2 mg daily, he reports no headaches, or jaw claudication. He denies any changes in vision, or onset of malaise. The patient will be going for cataract surgery in the next several months. He returns for an evaluation. Overall, he feels quite well. He has a history of macular degeneration, he has received injections for this with good improvement.  Past Medical History  Diagnosis Date  . Arthritis     temporal  . Spondylosis     cervical   . Degeneration macular   . Prostate cancer   . Hx of tonsillectomy   . Hx of biopsy     temporal artery  . Temporal arteritis 12/20/2012  . Hearing aid worn     Right    Past Surgical History  Procedure Laterality Date  . Radium seed implants for prostate cancer    . Tonsillectomy      Family History  Problem Relation Age of Onset  . Stroke Sister   . Seizures Sister   . Heart disease Brother     Social history:  reports that he has never smoked. He has never used smokeless tobacco. He reports that he does not drink alcohol or use illicit drugs.   No Known Allergies  Medications:  Prior to Admission medications   Medication Sig Start Date End Date Taking? Authorizing Provider  aspirin 81 MG tablet Take 81 mg by mouth every other day. One tablet   Yes Historical Provider, MD  B Complex Vitamins (B COMPLEX 50) TABS Take 1 tablet by mouth daily.   Yes Historical Provider, MD  Calcium Carbonate-Vitamin D (CALTRATE 600+D PO) Take by mouth. Twice daily, chew   Yes Historical Provider, MD  cholecalciferol (VITAMIN D) 1000 UNITS tablet Take 1,000 Units by mouth 2 (two) times daily.   Yes Historical Provider, MD  predniSONE (DELTASONE) 1 MG tablet Take as  directed Patient taking differently: Take 2 mg by mouth daily with breakfast. Take as directed 05/14/14  Yes Kathrynn Ducking, MD    ROS:  Out of a complete 14 system review of symptoms, the patient complains only of the following symptoms, and all other reviewed systems are negative.  Unremarkable  Blood pressure 125/72, pulse 72, height 5\' 7"  (1.702 m), weight 162 lb 9.6 oz (73.755 kg).  Physical Exam  General: The patient is alert and cooperative at the time of the examination.  Skin: No significant peripheral edema is noted.   Neurologic Exam  Mental status: The patient is alert and oriented x 3 at the time of the examination. The patient has apparent normal recent and remote memory, with an apparently normal attention span and concentration ability.   Cranial nerves: Facial symmetry is present. Speech is normal, no aphasia or dysarthria is noted. Extraocular movements are full. Visual fields are full. Pupils are equal, round, and reactive to light. Discs are flat bilaterally.  Motor: The patient has good strength in all 4 extremities.  Sensory examination: Soft touch sensation is symmetric on the face, arms, and legs.  Coordination: The patient has good finger-nose-finger and heel-to-shin bilaterally.  Gait and station: The patient has a normal gait. Tandem gait is normal. Romberg is  negative. No drift is seen.  Reflexes: Deep tendon reflexes are symmetric.   Assessment/Plan:  1. Temporal arteritis  2. Macular degeneration  The patient is doing quite well on a low dose of prednisone. He will likely be able to come off of the medication completely. He will go down to 1 mg daily for 6 weeks, and then stop the medication. A sedimentation rate will be done today. He will follow-up in 6 months.  Jeremiah Alexanders MD 11/12/2014 7:20 PM  [facility] [address] [place], [place] [address]  Phone [phone] Fax [phone]

## 2014-11-13 ENCOUNTER — Telehealth: Payer: Self-pay | Admitting: Neurology

## 2014-11-13 ENCOUNTER — Telehealth: Payer: Self-pay

## 2014-11-13 LAB — SEDIMENTATION RATE: Sed Rate: 17 mm/hr (ref 0–30)

## 2014-11-13 MED ORDER — PREDNISONE 1 MG PO TABS: ORAL_TABLET | ORAL | Status: DC

## 2014-11-13 NOTE — Telephone Encounter (Signed)
Spoke to patient's wife and informed her that Dr. Jannifer Franklin said the patient could stop taking the calcium and vitamin D if he wanted to.

## 2014-11-13 NOTE — Telephone Encounter (Signed)
Rx has been resent.  Pharmacy confirmed receipt.

## 2014-11-13 NOTE — Telephone Encounter (Signed)
Spoke to patient's wife and informed her the lab work was normal. She asked about whether the patient needed to continue taking calcium and vitamin D. I told her I would check with Dr. Jannifer Franklin and let her know.

## 2014-11-13 NOTE — Telephone Encounter (Signed)
Pt's wife is calling back stating the Rx for predniSONE (DELTASONE) 1 MG tablet still has not been sent to [facility] in [place].  Please advise.

## 2014-12-25 DIAGNOSIS — H2511 Age-related nuclear cataract, right eye: Secondary | ICD-10-CM | POA: Diagnosis not present

## 2015-01-15 DIAGNOSIS — H2511 Age-related nuclear cataract, right eye: Secondary | ICD-10-CM | POA: Diagnosis not present

## 2015-01-15 DIAGNOSIS — Z7982 Long term (current) use of aspirin: Secondary | ICD-10-CM | POA: Diagnosis not present

## 2015-01-15 DIAGNOSIS — H25811 Combined forms of age-related cataract, right eye: Secondary | ICD-10-CM | POA: Diagnosis not present

## 2015-01-15 DIAGNOSIS — H259 Unspecified age-related cataract: Secondary | ICD-10-CM | POA: Diagnosis not present

## 2015-01-22 DIAGNOSIS — M7551 Bursitis of right shoulder: Secondary | ICD-10-CM | POA: Diagnosis not present

## 2015-01-24 DIAGNOSIS — M25511 Pain in right shoulder: Secondary | ICD-10-CM | POA: Diagnosis not present

## 2015-01-28 DIAGNOSIS — M25511 Pain in right shoulder: Secondary | ICD-10-CM | POA: Diagnosis not present

## 2015-01-28 DIAGNOSIS — S43491A Other sprain of right shoulder joint, initial encounter: Secondary | ICD-10-CM | POA: Diagnosis not present

## 2015-01-30 DIAGNOSIS — M25511 Pain in right shoulder: Secondary | ICD-10-CM | POA: Diagnosis not present

## 2015-02-19 DIAGNOSIS — H2512 Age-related nuclear cataract, left eye: Secondary | ICD-10-CM | POA: Diagnosis not present

## 2015-02-19 DIAGNOSIS — Z7982 Long term (current) use of aspirin: Secondary | ICD-10-CM | POA: Diagnosis not present

## 2015-02-19 DIAGNOSIS — Z79899 Other long term (current) drug therapy: Secondary | ICD-10-CM | POA: Diagnosis not present

## 2015-02-19 DIAGNOSIS — I493 Ventricular premature depolarization: Secondary | ICD-10-CM | POA: Diagnosis not present

## 2015-02-19 DIAGNOSIS — H269 Unspecified cataract: Secondary | ICD-10-CM | POA: Diagnosis not present

## 2015-02-19 DIAGNOSIS — H25812 Combined forms of age-related cataract, left eye: Secondary | ICD-10-CM | POA: Diagnosis not present

## 2015-02-19 DIAGNOSIS — H26492 Other secondary cataract, left eye: Secondary | ICD-10-CM | POA: Diagnosis not present

## 2015-03-19 DIAGNOSIS — Z961 Presence of intraocular lens: Secondary | ICD-10-CM | POA: Diagnosis not present

## 2015-04-23 DIAGNOSIS — I493 Ventricular premature depolarization: Secondary | ICD-10-CM | POA: Diagnosis not present

## 2015-04-23 DIAGNOSIS — M316 Other giant cell arteritis: Secondary | ICD-10-CM | POA: Diagnosis not present

## 2015-04-23 DIAGNOSIS — R531 Weakness: Secondary | ICD-10-CM | POA: Diagnosis not present

## 2015-04-25 ENCOUNTER — Other Ambulatory Visit: Payer: Self-pay

## 2015-04-25 ENCOUNTER — Telehealth: Payer: Self-pay

## 2015-04-25 ENCOUNTER — Other Ambulatory Visit: Payer: Self-pay | Admitting: Adult Health

## 2015-04-25 DIAGNOSIS — M316 Other giant cell arteritis: Secondary | ICD-10-CM

## 2015-04-25 NOTE — Telephone Encounter (Signed)
I called the patient's wife. I explained that we DO want the patient to start the prednisone today. I advised that it may cause some insomnia tonight but it is important to go ahead and get a dose in his system. She verbalized understanding and is going to the pharmacy to get the medication now.

## 2015-04-25 NOTE — Telephone Encounter (Signed)
I called and spoke to the patient's wife to r/s his appointment for 9/16. She stated they had just spoke to the patient's PCP who stated his sedimentation rate was 60 when they checked it 2 days ago. The patient's wife stated that Dr. Jannifer Franklin questioned a sedimentation rate value previously and had another checked in the past. They asked if they should come in tomorrow to have it re-checked. Jinny Blossom, NP was in agreement to this plan. The patient will come first thing in the morning and Jinny Blossom stated she would call the patient Saturday to determine a plan. The last time the patient's sedimentation rate was checked in our office it was 17 (11-12-14). The patient's PCP prescribed prednisone 20 mg. Per Jinny Blossom, the patient should not take this until after hearing from her on Saturday morning. The patient's wife verbalized understanding. Appointment scheduled with Megan for 9/6 at 9 AM.

## 2015-04-25 NOTE — Telephone Encounter (Signed)
I spoke with Dr. Jaynee Eagles regarding this patient. Please call the patient and let him know to restart prednisone. If his Sedimentation rate comes by normal. We can then discontinue it. Dr. Jaynee Eagles also recommended that I order a CRP as well.

## 2015-04-26 ENCOUNTER — Other Ambulatory Visit (INDEPENDENT_AMBULATORY_CARE_PROVIDER_SITE_OTHER): Payer: Self-pay

## 2015-04-26 DIAGNOSIS — M316 Other giant cell arteritis: Secondary | ICD-10-CM

## 2015-04-26 DIAGNOSIS — Z0289 Encounter for other administrative examinations: Secondary | ICD-10-CM

## 2015-04-27 ENCOUNTER — Telehealth: Payer: Self-pay | Admitting: Adult Health

## 2015-04-27 LAB — C-REACTIVE PROTEIN: CRP: 41.4 mg/L — AB (ref 0.0–4.9)

## 2015-04-27 LAB — SEDIMENTATION RATE: Sed Rate: 25 mm/hr (ref 0–30)

## 2015-04-27 NOTE — Telephone Encounter (Signed)
Sedimentation rate is normal. CRP is elevated. Patient did begin prednisone prior to having bloodwork on Friday. For now the patient will continue Prednisone 20 mg daily. I will discuss with Dr. Jannifer Franklin when we return to the office on Tuesday. The patient has an appointment to see me Tuesday morning.

## 2015-04-30 ENCOUNTER — Encounter: Payer: Self-pay | Admitting: Adult Health

## 2015-04-30 ENCOUNTER — Ambulatory Visit (INDEPENDENT_AMBULATORY_CARE_PROVIDER_SITE_OTHER): Payer: Medicare Other | Admitting: Adult Health

## 2015-04-30 VITALS — BP 117/66 | HR 68 | Ht 67.0 in | Wt 159.0 lb

## 2015-04-30 DIAGNOSIS — M316 Other giant cell arteritis: Secondary | ICD-10-CM | POA: Diagnosis not present

## 2015-04-30 DIAGNOSIS — D649 Anemia, unspecified: Secondary | ICD-10-CM | POA: Diagnosis not present

## 2015-04-30 NOTE — Progress Notes (Addendum)
PATIENT: [name] DOB: 12-11-1935  REASON FOR VISIT: follow up- Temporal arteritis  HISTORY FROM: patient  HISTORY OF PRESENT ILLNESS: [name] is a 79 year old male with a history of temporal arteritis. At the last visit the patient was tapered off the prednisone and was doing well. However he went to his primary care last week and they drew a sedimentation rate which was elevated at 60. His primary care started him on prednisone 20 mg daily. The patient called our office to make [place] aware. The patient made me aware that in the past his blood work from his primary care has differed greatly from the blood work from our office. I had the patient come in for a repeat sedimentation rate and CRP. The patient sedimentation rate was in normal range however the CRP was elevated. Patient was instructed to continue on prednisone 20 mg daily. The patient reports this morning that he does not have any symptoms. He states that he does not have the headache and other associated symptoms that he had when he was first diagnosed with temporal arteritis. He does report some fatigue. However his blood work did show that he was anemic. His primary care is drawing an anemic panel and doing a stool sample this week. Overall patient feels that he is doing well. He returns today for an evaluation.  HISTORY 11/12/14: [name] is a 78 year old right-handed white male with a history of temporal arteritis. The patient has done very well as he has tapered down off of the prednisone. The patient is on 2 mg daily, he reports no headaches, or jaw claudication. He denies any changes in vision, or onset of malaise. The patient will be going for cataract surgery in the next several months. He returns for an evaluation. Overall, he feels quite well. He has a history of macular degeneration, he has received injections for this with good improvement.  REVIEW OF SYSTEMS: Out of a complete 14 system review of symptoms, the  patient complains only of the following symptoms, and all other reviewed systems are negative.  Constipation, joint pain, weakness  ALLERGIES: No Known Allergies  HOME MEDICATIONS: Outpatient Prescriptions Prior to Visit  Medication Sig Dispense Refill  . aspirin 81 MG tablet Take 81 mg by mouth every other day. One tablet    . B Complex Vitamins (B COMPLEX 50) TABS Take 1 tablet by mouth daily.    . Calcium Carbonate-Vitamin D (CALTRATE 600+D PO) Take by mouth. Twice daily, chew    . cholecalciferol (VITAMIN D) 1000 UNITS tablet Take 1,000 Units by mouth 2 (two) times daily.    . predniSONE (DELTASONE) 1 MG tablet Take one daily for 6 weeks, then stop (Patient not taking: Reported on 04/30/2015) 50 tablet 1   No facility-administered medications prior to visit.    PAST MEDICAL HISTORY: Past Medical History  Diagnosis Date  . Arthritis     temporal  . Spondylosis     cervical   . Degeneration macular   . Prostate cancer   . Hx of tonsillectomy   . Hx of biopsy     temporal artery  . Temporal arteritis 12/20/2012  . Hearing aid worn     Right    PAST SURGICAL HISTORY: Past Surgical History  Procedure Laterality Date  . Radium seed implants for prostate cancer    . Tonsillectomy      FAMILY HISTORY: Family History  Problem Relation Age of Onset  . Stroke Sister   .  Seizures Sister   . Heart disease Brother     SOCIAL HISTORY: Social History   Social History  . Marital Status: Married    Spouse Name: N/A  . Number of Children: 2  . Years of Education: hs   Occupational History  . Retired    Social History Main Topics  . Smoking status: Never Smoker   . Smokeless tobacco: Never Used  . Alcohol Use: No  . Drug Use: No  . Sexual Activity: Not on file   Other Topics Concern  . Not on file   Social History Narrative   Patient is right handed.   Patient drinks "very little" caffeine.      PHYSICAL EXAM  Filed Vitals:   04/30/15 0852  BP: 117/66    Pulse: 68  Height: 5\' 7"  (1.702 m)  Weight: 159 lb (72.122 kg)   Body mass index is 24.9 kg/(m^2).  Generalized: Well developed, in no acute distress   Neurological examination  Mentation: Alert oriented to time, place, history taking. Follows all commands speech and language fluent Cranial nerve II-XII:  Extraocular movements were full, visual field were full on confrontational test. Facial sensation and strength were normal. Uvula tongue midline. Head turning and shoulder shrug  were normal and symmetric. Motor: The motor testing reveals 5 over 5 strength of all 4 extremities. Good symmetric motor tone is noted throughout.  Sensory: Sensory testing is intact to soft touch on all 4 extremities. No evidence of extinction is noted.  Coordination: Cerebellar testing reveals good finger-nose-finger and heel-to-shin bilaterally.  Gait and station: Gait is normal. Tandem gait is normal. Romberg is negative. No drift is seen.  Reflexes: Deep tendon reflexes are symmetric and normal bilaterally.   DIAGNOSTIC DATA (LABS, IMAGING, TESTING) - I reviewed patient records, labs, notes, testing and imaging myself where available.   Abnormal results from PCP 04/24/2015: Sedimentation rate 63, C-reactive protein 42.1, RBC 3.95, Hgb 11.6, H CT 37, MCHC 31.4, BUN 23, creatinine 1.34  04/26/15: Sedimentation rate 25,  C-reactive protein 41.4   ASSESSMENT AND PLAN 79 y.o. year old male  has a past medical history of Arthritis; Spondylosis; Degeneration macular; Prostate cancer; tonsillectomy; biopsy; Temporal arteritis (12/20/2012); and Hearing aid worn. here with:  1. Temporal arteritis   I consulted with Dr. Jannifer Franklin. The patient will stay on prednisone 20 mg daily. I will recheck blood work in 4 weeks and pending those results we will decrease prednisone. We will try to wean the patient off prednisone at a faster rate pending his blood work and signs and symptoms. Patient and his wife are amenable to this  plan. He will follow-up with this office in 3-4 months.    Ward Givens, MSN, NP-C 04/30/2015, 9:24 AM [facility] [address], [place], [place] [address] [phone]

## 2015-04-30 NOTE — Progress Notes (Signed)
I have read the note, and I agree with the clinical assessment and plan.  Lamonta Cypress KEITH   

## 2015-04-30 NOTE — Patient Instructions (Signed)
Continue Prednisone 20 mg daily.  We will recheck blood work in 4 weeks. I will call you to come in. If your symptoms worsen or you develop new symptoms please let us know.

## 2015-05-03 DIAGNOSIS — Z1211 Encounter for screening for malignant neoplasm of colon: Secondary | ICD-10-CM | POA: Diagnosis not present

## 2015-05-10 ENCOUNTER — Ambulatory Visit: Payer: Medicare Other | Admitting: Neurology

## 2015-05-15 ENCOUNTER — Encounter: Payer: Self-pay | Admitting: Adult Health

## 2015-05-29 ENCOUNTER — Telehealth: Payer: Self-pay | Admitting: Adult Health

## 2015-05-29 DIAGNOSIS — M316 Other giant cell arteritis: Secondary | ICD-10-CM

## 2015-05-29 NOTE — Telephone Encounter (Signed)
Pt's wife call and is wondering about sed rate. Please call and advise [phone]

## 2015-05-29 NOTE — Telephone Encounter (Signed)
Spoke to patient's wife. Advised will send request to NP-Megan to have labs reordered. Spouse agreed.

## 2015-05-30 ENCOUNTER — Other Ambulatory Visit (INDEPENDENT_AMBULATORY_CARE_PROVIDER_SITE_OTHER): Payer: Self-pay

## 2015-05-30 DIAGNOSIS — M316 Other giant cell arteritis: Secondary | ICD-10-CM | POA: Diagnosis not present

## 2015-05-30 DIAGNOSIS — Z0289 Encounter for other administrative examinations: Secondary | ICD-10-CM

## 2015-05-30 NOTE — Telephone Encounter (Signed)
Lab orders placed. They can come in anytime to have this completed

## 2015-05-31 LAB — C-REACTIVE PROTEIN: CRP: 12.3 mg/L — AB (ref 0.0–4.9)

## 2015-05-31 LAB — SEDIMENTATION RATE: SED RATE: 22 mm/h (ref 0–30)

## 2015-06-03 ENCOUNTER — Telehealth: Payer: Self-pay | Admitting: Adult Health

## 2015-06-03 NOTE — Telephone Encounter (Signed)
I called the patient. His CRP is slightly elevated but Sed rate is in normal range. I consulted with Dr. Jannifer Franklin and at this time he will continue on Prednisone 20 mg daily. We will recheck lab work in 4 weeks.

## 2015-07-01 ENCOUNTER — Telehealth: Payer: Self-pay | Admitting: Adult Health

## 2015-07-01 DIAGNOSIS — M316 Other giant cell arteritis: Secondary | ICD-10-CM

## 2015-07-01 NOTE — Telephone Encounter (Signed)
I called and spoke to wife since pt out.  Relayed that time for one month repeat lab work.   Gave hours of operation for lab.  She verbalized understanding.

## 2015-07-01 NOTE — Telephone Encounter (Signed)
-----   Message from Ward Givens, NP sent at 06/03/2015  8:07 AM EDT ----- Regarding: CRP and Sed rate Recheck CRP and Sed rate

## 2015-07-01 NOTE — Telephone Encounter (Signed)
Patient needs to come in to have his sedimentation rate and CRP rechecked. Orders placed. Please call the patient.

## 2015-07-02 ENCOUNTER — Other Ambulatory Visit (INDEPENDENT_AMBULATORY_CARE_PROVIDER_SITE_OTHER): Payer: Self-pay

## 2015-07-02 DIAGNOSIS — M316 Other giant cell arteritis: Secondary | ICD-10-CM | POA: Diagnosis not present

## 2015-07-02 DIAGNOSIS — Z0289 Encounter for other administrative examinations: Secondary | ICD-10-CM

## 2015-07-03 ENCOUNTER — Telehealth: Payer: Self-pay | Admitting: Adult Health

## 2015-07-03 DIAGNOSIS — M316 Other giant cell arteritis: Secondary | ICD-10-CM

## 2015-07-03 LAB — SEDIMENTATION RATE: Sed Rate: 8 mm/hr (ref 0–30)

## 2015-07-03 LAB — C-REACTIVE PROTEIN: CRP: 2.6 mg/L (ref 0.0–4.9)

## 2015-07-03 MED ORDER — PREDNISONE 5 MG PO TABS: 5.0000 mg | ORAL_TABLET | Freq: Every day | ORAL | Status: DC

## 2015-07-03 MED ORDER — PREDNISONE 10 MG PO TABS: 10.0000 mg | ORAL_TABLET | Freq: Every day | ORAL | Status: DC

## 2015-07-03 NOTE — Telephone Encounter (Signed)
I called the patient and spoke to his wife. Patient has a hard time hearing on the telephone so she took a message for him. The patient's sedimentation rate and CRP level is now in normal range. We'll decrease his prednisone to 15 mg daily. He has a follow-up visit with me in December we will recheck his sedimentation rate and CRP level at that visit.

## 2015-07-29 ENCOUNTER — Encounter: Payer: Self-pay | Admitting: Adult Health

## 2015-07-29 ENCOUNTER — Ambulatory Visit (INDEPENDENT_AMBULATORY_CARE_PROVIDER_SITE_OTHER): Payer: Medicare Other | Admitting: Adult Health

## 2015-07-29 VITALS — BP 132/85 | HR 72 | Ht 67.0 in | Wt 159.5 lb

## 2015-07-29 DIAGNOSIS — M316 Other giant cell arteritis: Secondary | ICD-10-CM | POA: Diagnosis not present

## 2015-07-29 NOTE — Progress Notes (Signed)
I have read the note, and I agree with the clinical assessment and plan.  Paxton Binns KEITH   

## 2015-07-29 NOTE — Progress Notes (Signed)
PATIENT: [name] DOB: 06-02-1936  REASON FOR VISIT: follow up- temporal arteritis HISTORY FROM: patient  HISTORY OF PRESENT ILLNESS: [name] is a 79 year old male with a history of temporal arteritis. He returns today for follow-up visit. He is currently on 15 mg of prednisone and tolerating it well. He states that he did have a headache yesterday that was similar to the headaches he had when he was first diagnosed with temporal arteritis. He reports that the headache only lasted one hour and never returned. He denies any tenderness in the temporal regions. Denies any changes with his vision. Overall he feels that he is doing well. Denies any new neurological symptoms. He returns today for an evaluation.   HISTORY 04/30/15: [name] is a 79 year old male with a history of temporal arteritis. At the last visit the patient was tapered off the prednisone and was doing well. However he went to his primary care last week and they drew a sedimentation rate which was elevated at 60. His primary care started him on prednisone 20 mg daily. The patient called our office to make [place] aware. The patient made me aware that in the past his blood work from his primary care has differed greatly from the blood work from our office. I had the patient come in for a repeat sedimentation rate and CRP. The patient sedimentation rate was in normal range however the CRP was elevated. Patient was instructed to continue on prednisone 20 mg daily. The patient reports this morning that he does not have any symptoms. He states that he does not have the headache and other associated symptoms that he had when he was first diagnosed with temporal arteritis. He does report some fatigue. However his blood work did show that he was anemic. His primary care is drawing an anemic panel and doing a stool sample this week. Overall patient feels that he is doing well. He returns today for an evaluation.  HISTORY 11/12/14: Mr.  Huynh is a 79 year old right-handed white male with a history of temporal arteritis. The patient has done very well as he has tapered down off of the prednisone. The patient is on 2 mg daily, he reports no headaches, or jaw claudication. He denies any changes in vision, or onset of malaise. The patient will be going for cataract surgery in the next several months. He returns for an evaluation. Overall, he feels quite well. He has a history of macular degeneration, he has received injections for this with good improvement.  REVIEW OF SYSTEMS: Out of a complete 14 system review of symptoms, the patient complains only of the following symptoms, and all other reviewed systems are negative.  Hearing loss  ALLERGIES: No Known Allergies  HOME MEDICATIONS: Outpatient Prescriptions Prior to Visit  Medication Sig Dispense Refill  . aspirin 81 MG tablet Take 81 mg by mouth every other day. One tablet    . B Complex Vitamins (B COMPLEX 50) TABS Take 1 tablet by mouth daily.    . Calcium Carbonate-Vitamin D (CALTRATE 600+D PO) Take by mouth. Twice daily, chew    . cholecalciferol (VITAMIN D) 1000 UNITS tablet Take 1,000 Units by mouth 2 (two) times daily.    . predniSONE (DELTASONE) 10 MG tablet Take 1 tablet (10 mg total) by mouth daily with breakfast. 30 tablet 3  . predniSONE (DELTASONE) 5 MG tablet Take 1 tablet (5 mg total) by mouth daily with breakfast. 30 tablet 1   No facility-administered medications prior to  visit.    PAST MEDICAL HISTORY: Past Medical History  Diagnosis Date  . Arthritis     temporal  . Spondylosis     cervical   . Degeneration macular   . Prostate cancer ([place])   . Hx of tonsillectomy   . Hx of biopsy     temporal artery  . Temporal arteritis ([place]) 12/20/2012  . Hearing aid worn     Right    PAST SURGICAL HISTORY: Past Surgical History  Procedure Laterality Date  . Radium seed implants for prostate cancer    . Tonsillectomy      FAMILY HISTORY: Family  History  Problem Relation Age of Onset  . Stroke Sister   . Seizures Sister   . Heart disease Brother     SOCIAL HISTORY: Social History   Social History  . Marital Status: Married    Spouse Name: N/A  . Number of Children: 2  . Years of Education: hs   Occupational History  . Retired    Social History Main Topics  . Smoking status: Never Smoker   . Smokeless tobacco: Never Used  . Alcohol Use: No  . Drug Use: No  . Sexual Activity: Not on file   Other Topics Concern  . Not on file   Social History Narrative   Patient is right handed.   Patient drinks "very little" caffeine.      PHYSICAL EXAM  Filed Vitals:   07/29/15 0924  BP: 132/85  Pulse: 72  Height: 5\' 7"  (1.702 m)  Weight: 159 lb 8 oz (72.349 kg)   Body mass index is 24.98 kg/(m^2).  Generalized: Well developed, in no acute distress   Neurological examination  Mentation: Alert oriented to time, place, history taking. Follows all commands speech and language fluent Cranial nerve II-XII: Pupils were equal round reactive to light. Extraocular movements were full, visual field were full on confrontational test. Facial sensation and strength were normal. Uvula tongue midline. Head turning and shoulder shrug  were normal and symmetric. Motor: The motor testing reveals 5 over 5 strength of all 4 extremities. Good symmetric motor tone is noted throughout.  Sensory: Sensory testing is intact to soft touch on all 4 extremities. No evidence of extinction is noted.  Coordination: Cerebellar testing reveals good finger-nose-finger and heel-to-shin bilaterally.  Gait and station: Gait is normal. Tandem gait is normal. Romberg is negative. No drift is seen.  Reflexes: Deep tendon reflexes are symmetric and normal bilaterally.   DIAGNOSTIC DATA (LABS, IMAGING, TESTING) - I reviewed patient records, labs, notes, testing and imaging myself where available.   ASSESSMENT AND PLAN 79 y.o. year old male  has a past  medical history of Arthritis; Spondylosis; Degeneration macular; Prostate cancer ([place]); tonsillectomy; biopsy; Temporal arteritis ([place]) (12/20/2012); and Hearing aid worn. here with:  1. Temporal arteritis  Overall the patient is doing well. I will check blood work today. If his sedimentation rate and CRP remain in normal range we will decrease his prednisone. If the patient is amenable to this plan. He will follow-up in 3 months or sooner if needed.  Ward Givens, MSN, NP-C 07/29/2015, 9:44 AM [facility] [address], [place],  [address] [phone]

## 2015-07-29 NOTE — Patient Instructions (Signed)
Continue Prednisone 15 mg daily  I will check blood work today If your symptoms worsen or you develop new symptoms please let us know.

## 2015-07-30 ENCOUNTER — Ambulatory Visit: Payer: Medicare Other | Admitting: Adult Health

## 2015-07-30 ENCOUNTER — Telehealth: Payer: Self-pay | Admitting: Adult Health

## 2015-07-30 LAB — C-REACTIVE PROTEIN: CRP: 1.9 mg/L (ref 0.0–4.9)

## 2015-07-30 LAB — SEDIMENTATION RATE: Sed Rate: 2 mm/hr (ref 0–30)

## 2015-07-30 NOTE — Telephone Encounter (Signed)
I called the patient and left a message for him to return my call.  

## 2015-07-30 NOTE — Telephone Encounter (Signed)
Patient returned call, states he will be home the rest of the evening.

## 2015-07-31 NOTE — Telephone Encounter (Signed)
I called the patient and spoke to his wife. Sed rate and CRP are in normal range. Prednisone will be decreased to 10 mg daily. He will return in 3 weeks for repeat blood work.

## 2015-07-31 NOTE — Progress Notes (Signed)
Quick Note:  Results are normal. Please see phone note. ______

## 2015-07-31 NOTE — Addendum Note (Signed)
Addended by: Trudie Buckler on: 07/31/2015 09:18 AM   Modules accepted: Orders, Medications

## 2015-08-28 ENCOUNTER — Telehealth: Payer: Self-pay | Admitting: Adult Health

## 2015-08-28 DIAGNOSIS — M316 Other giant cell arteritis: Secondary | ICD-10-CM

## 2015-08-28 NOTE — Telephone Encounter (Signed)
Please have patient come in for blood work: Sed rate and CRP. Orders are in.

## 2015-08-28 NOTE — Telephone Encounter (Signed)
Spoke to spouse [name]. Provided instructions per Megan's previous notes; with lab hours. Spouse verbalized understanding.

## 2015-08-29 ENCOUNTER — Other Ambulatory Visit (INDEPENDENT_AMBULATORY_CARE_PROVIDER_SITE_OTHER): Payer: Self-pay

## 2015-08-29 ENCOUNTER — Other Ambulatory Visit: Payer: Self-pay | Admitting: Adult Health

## 2015-08-29 DIAGNOSIS — Z0289 Encounter for other administrative examinations: Secondary | ICD-10-CM

## 2015-08-29 DIAGNOSIS — M316 Other giant cell arteritis: Secondary | ICD-10-CM

## 2015-08-30 ENCOUNTER — Telehealth: Payer: Self-pay | Admitting: Adult Health

## 2015-08-30 LAB — SEDIMENTATION RATE: Sed Rate: 5 mm/hr (ref 0–30)

## 2015-08-30 LAB — C-REACTIVE PROTEIN: CRP: 3.8 mg/L (ref 0.0–4.9)

## 2015-08-30 MED ORDER — PREDNISONE 1 MG PO TABS: 3.0000 mg | ORAL_TABLET | Freq: Every day | ORAL | Status: DC

## 2015-08-30 MED ORDER — PREDNISONE 5 MG PO TABS: 5.0000 mg | ORAL_TABLET | Freq: Every day | ORAL | Status: DC

## 2015-08-30 NOTE — Telephone Encounter (Signed)
The patient's sedimentation rate and CRP remain in normal range. I will decrease the patient's prednisone 8 mg daily. The patient will have repeat lab work in 3 weeks. I spoke to the patient's wife and made her aware. The patient has a hard time hearing so he requests that I give this information to his wife.

## 2015-09-30 ENCOUNTER — Other Ambulatory Visit: Payer: Self-pay | Admitting: *Deleted

## 2015-09-30 ENCOUNTER — Other Ambulatory Visit (INDEPENDENT_AMBULATORY_CARE_PROVIDER_SITE_OTHER): Payer: Self-pay

## 2015-09-30 ENCOUNTER — Telehealth: Payer: Self-pay | Admitting: Adult Health

## 2015-09-30 DIAGNOSIS — M316 Other giant cell arteritis: Secondary | ICD-10-CM

## 2015-09-30 DIAGNOSIS — Z0289 Encounter for other administrative examinations: Secondary | ICD-10-CM

## 2015-09-30 NOTE — Telephone Encounter (Signed)
Wife called to advise, when [name] was here 08/30/15, Jinny Blossom advised to come back in 3 weeks to have labs rechecked and would call patient, wife states it's been over 4 weeks and they haven't heard anything. Wife needs to know if there is an order for [name] to have this labwork.

## 2015-09-30 NOTE — Telephone Encounter (Signed)
Spoke to wife and relayed hours and order are in for lab work.  They will be in tomorrow.

## 2015-10-01 ENCOUNTER — Telehealth: Payer: Self-pay | Admitting: Adult Health

## 2015-10-01 LAB — SEDIMENTATION RATE: SED RATE: 10 mm/h (ref 0–30)

## 2015-10-01 LAB — C-REACTIVE PROTEIN: CRP: 9.3 mg/L — AB (ref 0.0–4.9)

## 2015-10-01 NOTE — Telephone Encounter (Signed)
I called the patient. His CRP increased but sedimentation rate is normal. I discussed this with Dr. Jannifer Franklin- for now we will leave the Prednisone at this current dose 6 mg daily. We will recheck lab work in 1 month

## 2015-10-28 DIAGNOSIS — C61 Malignant neoplasm of prostate: Secondary | ICD-10-CM | POA: Diagnosis not present

## 2015-10-28 DIAGNOSIS — Z79899 Other long term (current) drug therapy: Secondary | ICD-10-CM | POA: Diagnosis not present

## 2015-10-28 DIAGNOSIS — M316 Other giant cell arteritis: Secondary | ICD-10-CM | POA: Diagnosis not present

## 2015-10-28 DIAGNOSIS — Z0000 Encounter for general adult medical examination without abnormal findings: Secondary | ICD-10-CM | POA: Diagnosis not present

## 2015-10-28 DIAGNOSIS — E785 Hyperlipidemia, unspecified: Secondary | ICD-10-CM | POA: Diagnosis not present

## 2015-10-28 DIAGNOSIS — Z789 Other specified health status: Secondary | ICD-10-CM | POA: Diagnosis not present

## 2015-10-29 ENCOUNTER — Telehealth: Payer: Self-pay | Admitting: Adult Health

## 2015-10-29 DIAGNOSIS — M316 Other giant cell arteritis: Secondary | ICD-10-CM

## 2015-10-29 NOTE — Telephone Encounter (Signed)
Please call the patient and have him come in for blood work. His sedimentation rate and CRP is to be rechecked. The orders have been placed.

## 2015-10-30 NOTE — Telephone Encounter (Signed)
Spoke to patient. He has appt in AM and will get blood work done at that time.

## 2015-10-31 ENCOUNTER — Encounter: Payer: Self-pay | Admitting: Adult Health

## 2015-10-31 ENCOUNTER — Ambulatory Visit (INDEPENDENT_AMBULATORY_CARE_PROVIDER_SITE_OTHER): Payer: Medicare Other | Admitting: Adult Health

## 2015-10-31 DIAGNOSIS — M316 Other giant cell arteritis: Secondary | ICD-10-CM

## 2015-10-31 DIAGNOSIS — H35371 Puckering of macula, right eye: Secondary | ICD-10-CM | POA: Diagnosis not present

## 2015-10-31 NOTE — Patient Instructions (Signed)
Will check lab work today If your symptoms worsen or you develop new symptoms please let us know.

## 2015-10-31 NOTE — Progress Notes (Signed)
PATIENT: [name] DOB: 1935/12/24  REASON FOR VISIT: follow up- to her arthritis HISTORY FROM: patient  HISTORY OF PRESENT ILLNESS: [name] is a 80 year old male with a history of temporal arteritis. He returns today for follow-up. The patient states that he will occasionally have a headache. He states that the headache is located "all over the head." He describes this discomfort as achy pain but occasionally will have a sharp shooting pain. He denies any tenderness in the temporal region. He denies nausea, vomiting photophobia and phonophobia. He states that these headaches are somewhat similar to what he had when he was diagnosed with temporal arteritis. He states that the headache is not consistent and typically resolves very quickly. He is currently taking 8 mg of prednisone. His last sedimentation and CRP rate was in normal range. He returns today for an evaluation.  Update: 07/29/15 (MM): [name] is a 80 year old male with a history of temporal arteritis. He returns today for follow-up visit. He is currently on 15 mg of prednisone and tolerating it well. He states that he did have a headache yesterday that was similar to the headaches he had when he was first diagnosed with temporal arteritis. He reports that the headache only lasted one hour and never returned. He denies any tenderness in the temporal regions. Denies any changes with his vision. Overall he feels that he is doing well. Denies any new neurological symptoms. He returns today for an evaluation.   HISTORY 04/30/15: [name] is a 80 year old male with a history of temporal arteritis. At the last visit the patient was tapered off the prednisone and was doing well. However he went to his primary care last week and they drew a sedimentation rate which was elevated at 60. His primary care started him on prednisone 20 mg daily. The patient called our office to make [place] aware. The patient made me aware that in the past his  blood work from his primary care has differed greatly from the blood work from our office. I had the patient come in for a repeat sedimentation rate and CRP. The patient sedimentation rate was in normal range however the CRP was elevated. Patient was instructed to continue on prednisone 20 mg daily. The patient reports this morning that he does not have any symptoms. He states that he does not have the headache and other associated symptoms that he had when he was first diagnosed with temporal arteritis. He does report some fatigue. However his blood work did show that he was anemic. His primary care is drawing an anemic panel and doing a stool sample this week. Overall patient feels that he is doing well. He returns today for an evaluation.   REVIEW OF SYSTEMS: Out of a complete 14 system review of symptoms, the patient complains only of the following symptoms, and all other reviewed systems are negative.  Constipation, headache  ALLERGIES: No Known Allergies  HOME MEDICATIONS: Outpatient Prescriptions Prior to Visit  Medication Sig Dispense Refill  . aspirin 81 MG tablet Take 81 mg by mouth every other day. One tablet    . B Complex Vitamins (B COMPLEX 50) TABS Take 1 tablet by mouth daily.    . Calcium Carbonate-Vitamin D (CALTRATE 600+D PO) Take by mouth. Twice daily, chew    . cholecalciferol (VITAMIN D) 1000 UNITS tablet Take 1,000 Units by mouth 2 (two) times daily.    . predniSONE (DELTASONE) 1 MG tablet Take 3 tablets (3 mg total) by  mouth daily with breakfast. 90 tablet 3  . predniSONE (DELTASONE) 5 MG tablet Take 1 tablet (5 mg total) by mouth daily with breakfast. 30 tablet 3   No facility-administered medications prior to visit.    PAST MEDICAL HISTORY: Past Medical History  Diagnosis Date  . Arthritis     temporal  . Spondylosis     cervical   . Degeneration macular   . Prostate cancer ([place])   . Hx of tonsillectomy   . Hx of biopsy     temporal artery  . Temporal  arteritis ([place]) 12/20/2012  . Hearing aid worn     Right    PAST SURGICAL HISTORY: Past Surgical History  Procedure Laterality Date  . Radium seed implants for prostate cancer    . Tonsillectomy      FAMILY HISTORY: Family History  Problem Relation Age of Onset  . Stroke Sister   . Seizures Sister   . Heart disease Brother     SOCIAL HISTORY: Social History   Social History  . Marital Status: Married    Spouse Name: N/A  . Number of Children: 2  . Years of Education: hs   Occupational History  . Retired    Social History Main Topics  . Smoking status: Never Smoker   . Smokeless tobacco: Never Used  . Alcohol Use: No  . Drug Use: No  . Sexual Activity: Not on file   Other Topics Concern  . Not on file   Social History Narrative   Patient is right handed.   Patient drinks "very little" caffeine.      PHYSICAL EXAM  Filed Vitals:   10/31/15 1001  BP: 139/73  Pulse: 56  Height: 5\' 7"  (1.702 m)  Weight: 163 lb 12.8 oz (74.299 kg)   Body mass index is 25.65 kg/(m^2).  Generalized: Well developed, in no acute distress   Neurological examination  Mentation: Alert oriented to time, place, history taking. Follows all commands speech and language fluent Cranial nerve II-XII: Pupils were equal round reactive to light. Extraocular movements were full, visual field were full on confrontational test. Facial sensation and strength were normal. Uvula tongue midline. Head turning and shoulder shrug  were normal and symmetric. Motor: The motor testing reveals 5 over 5 strength of all 4 extremities. Good symmetric motor tone is noted throughout.  Sensory: Sensory testing is intact to soft touch on all 4 extremities. No evidence of extinction is noted.  Coordination: Cerebellar testing reveals good finger-nose-finger and heel-to-shin bilaterally.  Gait and station: Gait is normal. Tandem gait is normal. Romberg is negative. No drift is seen.  Reflexes: Deep tendon  reflexes are symmetric and normal bilaterally.   DIAGNOSTIC DATA (LABS, IMAGING, TESTING) - I reviewed patient records, labs, notes, testing and imaging myself where available.  Erythrocyte Sedimentation Rate     Component Value Date/Time   ESRSEDRATE 10 09/30/2015 1423    ASSESSMENT AND PLAN 80 y.o. year old male  has a past medical history of Arthritis; Spondylosis; Degeneration macular; Prostate cancer ([place]); tonsillectomy; biopsy; Temporal arteritis ([place]) (12/20/2012); and Hearing aid worn. here with:  1. Temporal arteritis  The patient will remain on prednisone 8 mg daily. I will check sedimentation rate and CRP levels today. The results of these labs will determine his dose of prednisone will change. Patient advised that if his symptoms worsen or he develops any new symptoms he should let us know. He will follow-up in 3-4 months or sooner if needed.  Ward Givens, MSN, NP-C 10/31/2015, 10:33 AM [facility] [address] [place], [place] [address] [phone]

## 2015-10-31 NOTE — Progress Notes (Signed)
I have read the note, and I agree with the clinical assessment and plan.  Severina Sykora KEITH   

## 2015-11-01 ENCOUNTER — Telehealth: Payer: Self-pay | Admitting: Adult Health

## 2015-11-01 LAB — C-REACTIVE PROTEIN: CRP: 19 mg/L — AB (ref 0.0–4.9)

## 2015-11-01 LAB — SEDIMENTATION RATE: Sed Rate: 14 mm/hr (ref 0–30)

## 2015-11-01 MED ORDER — PREDNISONE 5 MG PO TABS: 10.0000 mg | ORAL_TABLET | Freq: Every day | ORAL | Status: DC

## 2015-11-01 NOTE — Telephone Encounter (Signed)
I called the patient and spoke to his wife. His CRP level is elevated but Sed rate is normal. I consulted with Dr. Jannifer Franklin. We will increase Prednisone to 10 mg daily. His wife voiced understanding.

## 2015-11-15 ENCOUNTER — Telehealth: Payer: Self-pay | Admitting: Neurology

## 2015-11-15 DIAGNOSIS — R1111 Vomiting without nausea: Secondary | ICD-10-CM | POA: Diagnosis not present

## 2015-11-15 DIAGNOSIS — R112 Nausea with vomiting, unspecified: Secondary | ICD-10-CM | POA: Diagnosis not present

## 2015-11-15 DIAGNOSIS — R509 Fever, unspecified: Secondary | ICD-10-CM | POA: Diagnosis not present

## 2015-11-15 DIAGNOSIS — R197 Diarrhea, unspecified: Secondary | ICD-10-CM | POA: Diagnosis not present

## 2015-11-15 DIAGNOSIS — K5900 Constipation, unspecified: Secondary | ICD-10-CM | POA: Diagnosis not present

## 2015-11-15 DIAGNOSIS — E869 Volume depletion, unspecified: Secondary | ICD-10-CM | POA: Diagnosis not present

## 2015-11-15 DIAGNOSIS — B349 Viral infection, unspecified: Secondary | ICD-10-CM | POA: Diagnosis not present

## 2015-11-15 NOTE — Telephone Encounter (Signed)
I spoke with daughter [phone]).   [name] has flet sick and has had vomiting multiple times the past couple hours.  They have some phenergan and will see if he can take that..   If not better, he should go to ER to get a shot for the vomiting and be checked out medically.    Daughter expressed understanding

## 2015-11-16 DIAGNOSIS — K5900 Constipation, unspecified: Secondary | ICD-10-CM | POA: Diagnosis not present

## 2015-11-20 DIAGNOSIS — B001 Herpesviral vesicular dermatitis: Secondary | ICD-10-CM | POA: Diagnosis not present

## 2015-12-03 ENCOUNTER — Other Ambulatory Visit: Payer: Self-pay | Admitting: Adult Health

## 2015-12-03 ENCOUNTER — Other Ambulatory Visit (INDEPENDENT_AMBULATORY_CARE_PROVIDER_SITE_OTHER): Payer: Self-pay

## 2015-12-03 ENCOUNTER — Telehealth: Payer: Self-pay | Admitting: Adult Health

## 2015-12-03 DIAGNOSIS — M316 Other giant cell arteritis: Secondary | ICD-10-CM | POA: Diagnosis not present

## 2015-12-03 DIAGNOSIS — Z0289 Encounter for other administrative examinations: Secondary | ICD-10-CM

## 2015-12-03 NOTE — Telephone Encounter (Signed)
Spoke to Patient's wife this am and relayed for patient to come have labs done. Relayed office hours and times for lab draw. Patient's wife understood.

## 2015-12-03 NOTE — Telephone Encounter (Signed)
Please have the patient come in for lab work. Sedimentation rate and CRP- orders are in epic.

## 2015-12-04 ENCOUNTER — Telehealth: Payer: Self-pay | Admitting: Adult Health

## 2015-12-04 LAB — SEDIMENTATION RATE: Sed Rate: 30 mm/hr (ref 0–30)

## 2015-12-04 LAB — C-REACTIVE PROTEIN: CRP: 8.8 mg/L — ABNORMAL HIGH (ref 0.0–4.9)

## 2015-12-04 NOTE — Telephone Encounter (Signed)
I called the patient that there was no answer and I was unable to leave a voicemail. I will try back tomorrow morning.

## 2015-12-05 NOTE — Telephone Encounter (Signed)
I called the patient. His sedimentation rate increased to 30 previous was 14. CRP actually improved. The patient is currently taking prednisone 10 mg daily. He reports that he is actually not having any headaches since we increased his medication. He is also not having any neck pain. For now he will continue on the 10 mg we will recheck blood work in a month and continue weaning off of prednisone. Patient verbalized understanding.

## 2016-01-13 ENCOUNTER — Other Ambulatory Visit: Payer: Self-pay | Admitting: *Deleted

## 2016-01-13 MED ORDER — PREDNISONE 5 MG PO TABS: 10.0000 mg | ORAL_TABLET | Freq: Every day | ORAL | Status: DC

## 2016-01-13 NOTE — Telephone Encounter (Signed)
Pt has appt 01-31-16 with MM/NP and then 04/2016 with Dr. Jannifer Franklin.

## 2016-01-16 DIAGNOSIS — H35371 Puckering of macula, right eye: Secondary | ICD-10-CM | POA: Diagnosis not present

## 2016-01-23 ENCOUNTER — Telehealth: Payer: Self-pay | Admitting: Adult Health

## 2016-01-23 NOTE — Telephone Encounter (Signed)
We can do blood work at the office visit. thanks

## 2016-01-23 NOTE — Telephone Encounter (Signed)
Wife called, wants to ask Jinny Blossom if husband should come in this week for bloodwork to check Sed rate and to check temporal arteritis, states sometimes they do bloodwork the day of appointment. Please call to advise.

## 2016-01-23 NOTE — Telephone Encounter (Signed)
I spoke to wife and she is aware of recommendation below and voiced understanding.

## 2016-01-23 NOTE — Telephone Encounter (Signed)
Has appt 6/8

## 2016-01-30 ENCOUNTER — Encounter: Payer: Self-pay | Admitting: Adult Health

## 2016-01-30 ENCOUNTER — Ambulatory Visit (INDEPENDENT_AMBULATORY_CARE_PROVIDER_SITE_OTHER): Payer: Medicare Other | Admitting: Adult Health

## 2016-01-30 VITALS — BP 125/67 | HR 59 | Ht 67.0 in | Wt 165.0 lb

## 2016-01-30 DIAGNOSIS — M316 Other giant cell arteritis: Secondary | ICD-10-CM

## 2016-01-30 NOTE — Progress Notes (Signed)
PATIENT: [name] DOB: 15-Jun-1936  REASON FOR VISIT: follow up- temporal arteritis HISTORY FROM: patient  HISTORY OF PRESENT ILLNESS: [name] is a 80 year old male with a history of temporal arteritis. He returns today for follow-up. The patient states that he has been doing well. He is on 10 mg of prednisone. He states that he may have an occasional headache but he does not relate this to temporal arteritis. He states that these headaches are not the same as the ones he had with temporal arteritis. He reports occasionally he'll have some low back pain on the left. He feels that this is muscle related and due to him playing golf. He denies any new neurological symptoms. He returns today for an evaluation.  HISTORY 10/31/15: [name] is a 80 year old male with a history of temporal arteritis. He returns today for follow-up. The patient states that he will occasionally have a headache. He states that the headache is located "all over the head." He describes this discomfort as achy pain but occasionally will have a sharp shooting pain. He denies any tenderness in the temporal region. He denies nausea, vomiting photophobia and phonophobia. He states that these headaches are somewhat similar to what he had when he was diagnosed with temporal arteritis. He states that the headache is not consistent and typically resolves very quickly. He is currently taking 8 mg of prednisone. His last sedimentation and CRP rate was in normal range. He returns today for an evaluation.  Update: 07/29/15 (MM): [name] is a 80 year old male with a history of temporal arteritis. He returns today for follow-up visit. He is currently on 15 mg of prednisone and tolerating it well. He states that he did have a headache yesterday that was similar to the headaches he had when he was first diagnosed with temporal arteritis. He reports that the headache only lasted one hour and never returned. He denies any tenderness  in the temporal regions. Denies any changes with his vision. Overall he feels that he is doing well. Denies any new neurological symptoms. He returns today for an evaluation.   HISTORY 04/30/15: [name] is a 80 year old male with a history of temporal arteritis. At the last visit the patient was tapered off the prednisone and was doing well. However he went to his primary care last week and they drew a sedimentation rate which was elevated at 60. His primary care started him on prednisone 20 mg daily. The patient called our office to make [place] aware. The patient made me aware that in the past his blood work from his primary care has differed greatly from the blood work from our office. I had the patient come in for a repeat sedimentation rate and CRP. The patient sedimentation rate was in normal range however the CRP was elevated. Patient was instructed to continue on prednisone 20 mg daily. The patient reports this morning that he does not have any symptoms. He states that he does not have the headache and other associated symptoms that he had when he was first diagnosed with temporal arteritis. He does report some fatigue. However his blood work did show that he was anemic. His primary care is drawing an anemic panel and doing a stool sample this week. Overall patient feels that he is doing well. He returns today for an evaluation.  REVIEW OF SYSTEMS: Out of a complete 14 system review of symptoms, the patient complains only of the following symptoms, and all other reviewed systems are  negative.  See history of present illness  ALLERGIES: No Known Allergies  HOME MEDICATIONS: Outpatient Prescriptions Prior to Visit  Medication Sig Dispense Refill  . aspirin 81 MG tablet Take 81 mg by mouth every other day. One tablet    . B Complex Vitamins (B COMPLEX 50) TABS Take 1 tablet by mouth daily.    . Calcium Carbonate-Vitamin D (CALTRATE 600+D PO) Take by mouth. Twice daily, chew    . cholecalciferol  (VITAMIN D) 1000 UNITS tablet Take 1,000 Units by mouth 2 (two) times daily.    . Multiple Vitamins-Minerals (PRESERVISION AREDS 2 PO) Take by mouth 2 (two) times daily.    . predniSONE (DELTASONE) 5 MG tablet Take 2 tablets (10 mg total) by mouth daily with breakfast. 180 tablet 0   No facility-administered medications prior to visit.    PAST MEDICAL HISTORY: Past Medical History  Diagnosis Date  . Arthritis     temporal  . Spondylosis     cervical   . Degeneration macular   . Prostate cancer ([place])   . Hx of tonsillectomy   . Hx of biopsy     temporal artery  . Temporal arteritis ([place]) 12/20/2012  . Hearing aid worn     Right    PAST SURGICAL HISTORY: Past Surgical History  Procedure Laterality Date  . Radium seed implants for prostate cancer    . Tonsillectomy      FAMILY HISTORY: Family History  Problem Relation Age of Onset  . Stroke Sister   . Seizures Sister   . Heart disease Brother     SOCIAL HISTORY: Social History   Social History  . Marital Status: Married    Spouse Name: N/A  . Number of Children: 2  . Years of Education: hs   Occupational History  . Retired    Social History Main Topics  . Smoking status: Never Smoker   . Smokeless tobacco: Never Used  . Alcohol Use: No  . Drug Use: No  . Sexual Activity: Not on file   Other Topics Concern  . Not on file   Social History Narrative   Patient is right handed.   Patient drinks "very little" caffeine.      PHYSICAL EXAM  Filed Vitals:   01/30/16 0852  BP: 125/67  Pulse: 59  Height: 5\' 7"  (1.702 m)  Weight: 165 lb (74.844 kg)   Body mass index is 25.84 kg/(m^2).  Generalized: Well developed, in no acute distress   Neurological examination  Mentation: Alert oriented to time, place, history taking. Follows all commands speech and language fluent Cranial nerve II-XII: Pupils were equal round reactive to light. Extraocular movements were full, visual field were full on  confrontational test. Facial sensation and strength were normal. Uvula tongue midline. Head turning and shoulder shrug  were normal and symmetric. Motor: The motor testing reveals 5 over 5 strength of all 4 extremities. Good symmetric motor tone is noted throughout.  Sensory: Sensory testing is intact to soft touch on all 4 extremities. No evidence of extinction is noted.  Coordination: Cerebellar testing reveals good finger-nose-finger and heel-to-shin bilaterally.  Gait and station: Gait is normal. Tandem gait is normal. Romberg is negative. No drift is seen.  Reflexes: Deep tendon reflexes are symmetric and normal bilaterally.   DIAGNOSTIC DATA (LABS, IMAGING, TESTING) - I reviewed patient records, labs, notes, testing and imaging myself where available.      ASSESSMENT AND PLAN 80 y.o. year old male  has a  past medical history of Arthritis; Spondylosis; Degeneration macular; Prostate cancer ([place]); tonsillectomy; biopsy; Temporal arteritis ([place]) (12/20/2012); and Hearing aid worn. here with:  1. Temporal arteritis  Overall the patient is doing well. He will continue on 10 mg of prednisone. I will check blood work. Pending the blood work we will continue to try to wean the patient off of prednisone. Patient advised that if his symptoms worsen or he develops any new symptoms he should let us know. He will follow-up in September with Dr. Jannifer Franklin.     Ward Givens, MSN, NP-C 01/30/2016, 9:04 AM [facility] [address], [place], [place] [address] [phone]

## 2016-01-30 NOTE — Patient Instructions (Signed)
Blood work today Continue prednisone 10 mg until blood work results.

## 2016-01-30 NOTE — Progress Notes (Signed)
I have read the note, and I agree with the clinical assessment and plan.  Mikalah Skyles KEITH   

## 2016-01-31 ENCOUNTER — Telehealth: Payer: Self-pay | Admitting: Adult Health

## 2016-01-31 LAB — SEDIMENTATION RATE: Sed Rate: 4 mm/hr (ref 0–30)

## 2016-01-31 LAB — C-REACTIVE PROTEIN: CRP: 7.4 mg/L — AB (ref 0.0–4.9)

## 2016-01-31 MED ORDER — PREDNISONE 5 MG PO TABS: 5.0000 mg | ORAL_TABLET | Freq: Every day | ORAL | Status: DC

## 2016-01-31 MED ORDER — PREDNISONE 1 MG PO TABS: 4.0000 mg | ORAL_TABLET | Freq: Every day | ORAL | Status: DC

## 2016-01-31 NOTE — Telephone Encounter (Signed)
Sedimentation rate is normal. CRP is slightly elevated but decreased since the last visit. Patent is not having headaches with the exception of a rare sinus headache every now and then. We will decrease Prednisone to 9 mg and recheck in 1 month.

## 2016-03-04 ENCOUNTER — Telehealth: Payer: Self-pay | Admitting: Adult Health

## 2016-03-04 DIAGNOSIS — M316 Other giant cell arteritis: Secondary | ICD-10-CM

## 2016-03-04 NOTE — Telephone Encounter (Signed)
Please call patient to come in for lab work.

## 2016-03-04 NOTE — Telephone Encounter (Signed)
-----   Message from Ward Givens, NP sent at 01/30/2016  5:05 PM EDT ----- labs

## 2016-03-04 NOTE — Telephone Encounter (Signed)
I spoke to pt and he will come in for labs in the am.

## 2016-03-05 ENCOUNTER — Other Ambulatory Visit (INDEPENDENT_AMBULATORY_CARE_PROVIDER_SITE_OTHER): Payer: Self-pay

## 2016-03-05 ENCOUNTER — Other Ambulatory Visit: Payer: Self-pay | Admitting: Adult Health

## 2016-03-05 DIAGNOSIS — Z0289 Encounter for other administrative examinations: Secondary | ICD-10-CM

## 2016-03-05 DIAGNOSIS — M316 Other giant cell arteritis: Secondary | ICD-10-CM

## 2016-03-05 NOTE — Telephone Encounter (Signed)
Patient requesting refill of  predniSONE (DELTASONE) 5 MG tablet Pharmacy:  [id number] - [place], [place]

## 2016-03-06 ENCOUNTER — Telehealth: Payer: Self-pay | Admitting: Adult Health

## 2016-03-06 LAB — SEDIMENTATION RATE: Sed Rate: 3 mm/hr (ref 0–30)

## 2016-03-06 LAB — C-REACTIVE PROTEIN: CRP: 4.7 mg/L (ref 0.0–4.9)

## 2016-03-06 MED ORDER — PREDNISONE 5 MG PO TABS: 5.0000 mg | ORAL_TABLET | Freq: Every day | ORAL | Status: DC

## 2016-03-06 NOTE — Telephone Encounter (Signed)
I called patient. No answer. Left message

## 2016-03-06 NOTE — Telephone Encounter (Addendum)
I called and gave verbal order for this below #90, [facility] [place] [facility]  (as was approved but no print).   (takes one tablet at breakfast).

## 2016-03-11 MED ORDER — PREDNISONE 1 MG PO TABS: 3.0000 mg | ORAL_TABLET | Freq: Every day | ORAL | Status: DC

## 2016-03-11 NOTE — Telephone Encounter (Signed)
Pt's wife returned call. They have been out of town. Please call

## 2016-03-11 NOTE — Telephone Encounter (Signed)
I spoke to the patient's wife. His lab work was normal. Will decrease prednisone to 8 mg. Recheck lab work in one month.

## 2016-03-11 NOTE — Addendum Note (Signed)
Addended by: Trudie Buckler on: 03/11/2016 03:35 PM   Modules accepted: Orders

## 2016-03-16 DIAGNOSIS — L989 Disorder of the skin and subcutaneous tissue, unspecified: Secondary | ICD-10-CM | POA: Diagnosis not present

## 2016-04-07 DIAGNOSIS — C44519 Basal cell carcinoma of skin of other part of trunk: Secondary | ICD-10-CM | POA: Diagnosis not present

## 2016-04-07 DIAGNOSIS — D235 Other benign neoplasm of skin of trunk: Secondary | ICD-10-CM | POA: Diagnosis not present

## 2016-04-07 DIAGNOSIS — L720 Epidermal cyst: Secondary | ICD-10-CM | POA: Diagnosis not present

## 2016-04-07 DIAGNOSIS — L821 Other seborrheic keratosis: Secondary | ICD-10-CM | POA: Diagnosis not present

## 2016-05-04 ENCOUNTER — Encounter: Payer: Self-pay | Admitting: Neurology

## 2016-05-04 ENCOUNTER — Ambulatory Visit (INDEPENDENT_AMBULATORY_CARE_PROVIDER_SITE_OTHER): Payer: Medicare Other | Admitting: Neurology

## 2016-05-04 ENCOUNTER — Other Ambulatory Visit: Payer: Self-pay | Admitting: *Deleted

## 2016-05-04 VITALS — BP 138/76 | HR 56 | Ht 67.0 in | Wt 167.5 lb

## 2016-05-04 DIAGNOSIS — M316 Other giant cell arteritis: Secondary | ICD-10-CM | POA: Diagnosis not present

## 2016-05-04 DIAGNOSIS — Z5181 Encounter for therapeutic drug level monitoring: Secondary | ICD-10-CM

## 2016-05-04 NOTE — Telephone Encounter (Signed)
I spoke to pt and pts wife.  He is on a tapering dose of prednisone and is taking 7mg  po daily starting today.  He has enough right now she stated for 1.5 months.  Will not do 90 day supply at this time.

## 2016-05-04 NOTE — Progress Notes (Signed)
Reason for visit: Temporal arteritis  [name] is an 80 y.o. male  History of present illness:  [name] is an 80 year old right-handed white male with a history of temporal arteritis. He has done well since last seen, he is on a taper of the prednisone, currently at 8 mg daily. The patient has continued to have low sedimentation rates. He is not having any headaches, vision changes, jaw claudication. He denies any significant malaise, but he does report some fatigue issues. He denies any other new medical issues that have come up since last seen.  Past Medical History:  Diagnosis Date  . Arthritis    temporal  . Degeneration macular   . Hearing aid worn    Right  . Hx of biopsy    temporal artery  . Hx of tonsillectomy   . Prostate cancer ([place])   . Spondylosis    cervical   . Temporal arteritis ([place]) 12/20/2012    Past Surgical History:  Procedure Laterality Date  . radium seed implants for prostate cancer    . TONSILLECTOMY      Family History  Problem Relation Age of Onset  . Stroke Sister   . Seizures Sister   . Heart disease Brother     Social history:  reports that he has never smoked. He has never used smokeless tobacco. He reports that he does not drink alcohol or use drugs.   No Known Allergies  Medications:  Prior to Admission medications   Medication Sig Start Date End Date Taking? Authorizing Provider  aspirin 81 MG tablet Take 81 mg by mouth every other day. One tablet    Historical Provider, MD  B Complex Vitamins (B COMPLEX 50) TABS Take 1 tablet by mouth daily.    Historical Provider, MD  Calcium Carbonate-Vitamin D (CALTRATE 600+D PO) Take by mouth. Twice daily, chew    Historical Provider, MD  cholecalciferol (VITAMIN D) 1000 UNITS tablet Take 1,000 Units by mouth 2 (two) times daily.    Historical Provider, MD  Multiple Vitamins-Minerals (PRESERVISION AREDS 2 PO) Take by mouth 2 (two) times daily.    Historical Provider, MD  predniSONE  (DELTASONE) 1 MG tablet Take 3 tablets (3 mg total) by mouth daily with breakfast. 03/11/16   Ward Givens, NP  predniSONE (DELTASONE) 5 MG tablet Take 1 tablet (5 mg total) by mouth daily with breakfast. 03/06/16   Ward Givens, NP    ROS:  Out of a complete 14 system review of symptoms, the patient complains only of the following symptoms, and all other reviewed systems are negative.  Hearing loss Joint pain  Blood pressure 138/76, pulse (!) 56, height 5\' 7"  (1.702 m), weight 167 lb 8 oz (76 kg).  Physical Exam  General: The patient is alert and cooperative at the time of the examination.  Skin: No significant peripheral edema is noted.   Neurologic Exam  Mental status: The patient is alert and oriented x 3 at the time of the examination. The patient has apparent normal recent and remote memory, with an apparently normal attention span and concentration ability.   Cranial nerves: Facial symmetry is present. Speech is normal, no aphasia or dysarthria is noted. Extraocular movements are full. Visual fields are full.  Motor: The patient has good strength in all 4 extremities.  Sensory examination: Soft touch sensation is symmetric on the face, arms, and legs.  Coordination: The patient has good finger-nose-finger and heel-to-shin bilaterally.  Gait and station: The  patient has a normal gait. Tandem gait is normal. Romberg is negative. No drift is seen.  Reflexes: Deep tendon reflexes are symmetric.   Assessment/Plan:  1. Temporal arteritis  The patient is doing well, he should be able to come off of the prednisone. The patient will taper by 1 mg every month until he gets to 5 mg daily. He will follow-up in 6 months. We will check a sedimentation rate today.   Jill Alexanders MD 05/04/2016 9:58 AM  [facility] [address] [place], [place] [address]  Phone [phone] Fax [phone]

## 2016-05-05 ENCOUNTER — Telehealth: Payer: Self-pay | Admitting: Neurology

## 2016-05-05 DIAGNOSIS — H353213 Exudative age-related macular degeneration, right eye, with inactive scar: Secondary | ICD-10-CM | POA: Diagnosis not present

## 2016-05-05 LAB — SEDIMENTATION RATE: Sed Rate: 35 mm/hr — ABNORMAL HIGH (ref 0–30)

## 2016-05-05 LAB — C-REACTIVE PROTEIN: CRP: 7.4 mg/L — ABNORMAL HIGH (ref 0.0–4.9)

## 2016-05-05 NOTE — Telephone Encounter (Signed)
I called patient. I talk with the wife. The sedimentation rate has climbed to around 35. He is to remain on the 7 mg of prednisone, we will recheck the blood work in 6 weeks.

## 2016-05-18 DIAGNOSIS — M545 Low back pain: Secondary | ICD-10-CM | POA: Diagnosis not present

## 2016-05-18 DIAGNOSIS — R109 Unspecified abdominal pain: Secondary | ICD-10-CM | POA: Diagnosis not present

## 2016-05-27 ENCOUNTER — Telehealth: Payer: Self-pay | Admitting: Adult Health

## 2016-05-27 MED ORDER — PREDNISONE 5 MG PO TABS: 5.0000 mg | ORAL_TABLET | Freq: Every day | ORAL | 0 refills | Status: DC

## 2016-05-27 NOTE — Telephone Encounter (Signed)
Spoke to wife and let her know.  She verbalized understanding.

## 2016-05-27 NOTE — Telephone Encounter (Signed)
[facility]

## 2016-05-27 NOTE — Telephone Encounter (Signed)
Pt's wife request refill for predniSONE (DELTASONE) 5 MG tablet sent to [facility]. Deborha Payment will be going out of town on Saturday. He has plenty of 1mg .

## 2016-06-16 ENCOUNTER — Telehealth: Payer: Self-pay | Admitting: Neurology

## 2016-06-16 ENCOUNTER — Other Ambulatory Visit (INDEPENDENT_AMBULATORY_CARE_PROVIDER_SITE_OTHER): Payer: Self-pay

## 2016-06-16 DIAGNOSIS — M316 Other giant cell arteritis: Secondary | ICD-10-CM

## 2016-06-16 DIAGNOSIS — Z5181 Encounter for therapeutic drug level monitoring: Secondary | ICD-10-CM

## 2016-06-16 DIAGNOSIS — Z0289 Encounter for other administrative examinations: Secondary | ICD-10-CM

## 2016-06-16 NOTE — Telephone Encounter (Signed)
I called patient, he has come in for a sedimentation rate to be drawn, last study was slightly elevated.

## 2016-06-17 ENCOUNTER — Telehealth: Payer: Self-pay | Admitting: Neurology

## 2016-06-17 LAB — SEDIMENTATION RATE: SED RATE: 9 mm/h (ref 0–30)

## 2016-06-17 NOTE — Telephone Encounter (Signed)
I called the patient. The ESR is 9. Will go from 7 mg to 6 mg a day for 4 weeks, then start 5 mg a day.

## 2016-08-04 DIAGNOSIS — H353131 Nonexudative age-related macular degeneration, bilateral, early dry stage: Secondary | ICD-10-CM | POA: Diagnosis not present

## 2016-08-04 DIAGNOSIS — Z961 Presence of intraocular lens: Secondary | ICD-10-CM | POA: Diagnosis not present

## 2016-09-03 ENCOUNTER — Encounter: Payer: Self-pay | Admitting: Adult Health

## 2016-09-03 ENCOUNTER — Ambulatory Visit (INDEPENDENT_AMBULATORY_CARE_PROVIDER_SITE_OTHER): Payer: Medicare Other | Admitting: Adult Health

## 2016-09-03 VITALS — BP 131/72 | HR 58 | Wt 170.4 lb

## 2016-09-03 DIAGNOSIS — M316 Other giant cell arteritis: Secondary | ICD-10-CM

## 2016-09-03 NOTE — Progress Notes (Signed)
I have read the note, and I agree with the clinical assessment and plan.  [name],[name]   

## 2016-09-03 NOTE — Progress Notes (Signed)
PATIENT: [name] DOB: [birth year]  REASON FOR VISIT: follow up HISTORY FROM: patient  HISTORY OF PRESENT ILLNESS: Today 09/03/2016: [name] is a 81 year old male with a history of temporal arteritis. He returns today for follow-up. He is currently on 5 mg of prednisone and tolerating it well. He denies any recurrence of symptoms. No headache or visual change. He reports that he does have arthritis that affects his right hip and right knee. He denies any new neurological symptoms. He returns today for an evaluation.  HISTORY 05/04/16: [name] is a 81 year old right-handed white male with a history of temporal arteritis. He has done well since last seen, he is on a taper of the prednisone, currently at 8 mg daily. The patient has continued to have low sedimentation rates. He is not having any headaches, vision changes, jaw claudication. He denies any significant malaise, but he does report some fatigue issues. He denies any other new medical issues that have come up since last seen.  REVIEW OF SYSTEMS: Out of a complete 14 system review of symptoms, the patient complains only of the following symptoms, and all other reviewed systems are negative.  Joint pain, aching muscles, hearing loss, runny nose  ALLERGIES: No Known Allergies  HOME MEDICATIONS: Outpatient Medications Prior to Visit  Medication Sig Dispense Refill  . aspirin 81 MG tablet Take 81 mg by mouth every other day. One tablet    . B Complex Vitamins (B COMPLEX 50) TABS Take 1 tablet by mouth daily.    . Calcium Carbonate-Vitamin D (CALTRATE 600+D PO) Take by mouth. Twice daily, chew    . cholecalciferol (VITAMIN D) 1000 UNITS tablet Take 1,000 Units by mouth 2 (two) times daily.    . Multiple Vitamins-Minerals (PRESERVISION AREDS 2 PO) Take by mouth 2 (two) times daily.    . predniSONE (DELTASONE) 5 MG tablet Take 1 tablet (5 mg total) by mouth daily with breakfast. 90 tablet 0  . predniSONE (DELTASONE) 1 MG  tablet Take 3 tablets (3 mg total) by mouth daily with breakfast. 120 tablet 3   No facility-administered medications prior to visit.     PAST MEDICAL HISTORY: Past Medical History:  Diagnosis Date  . Arthritis    temporal  . Degeneration macular   . Hearing aid worn    Right  . Hx of biopsy    temporal artery  . Hx of tonsillectomy   . Prostate cancer ([place])   . Spondylosis    cervical   . Temporal arteritis ([place]) 12/20/2012    PAST SURGICAL HISTORY: Past Surgical History:  Procedure Laterality Date  . radium seed implants for prostate cancer    . TONSILLECTOMY      FAMILY HISTORY: Family History  Problem Relation Age of Onset  . Stroke Sister   . Seizures Sister   . Heart disease Brother     SOCIAL HISTORY: Social History   Social History  . Marital status: Married    Spouse name: N/A  . Number of children: 2  . Years of education: hs   Occupational History  . Retired    Social History Main Topics  . Smoking status: Never Smoker  . Smokeless tobacco: Never Used  . Alcohol use No  . Drug use: No  . Sexual activity: Not on file   Other Topics Concern  . Not on file   Social History Narrative   Lives at home w/ his wife   Patient is right handed.  Patient drinks "very little" caffeine.      PHYSICAL EXAM  Vitals:   09/03/16 1005  BP: 131/72  Pulse: (!) 58  Weight: 170 lb 6.4 oz (77.3 kg)   Body mass index is 26.69 kg/m.  Generalized: Well developed, in no acute distress   Neurological examination  Mentation: Alert oriented to time, place, history taking. Follows all commands speech and language fluent Cranial nerve II-XII: Pupils were equal round reactive to light. Extraocular movements were full, visual field were full on confrontational test. Facial sensation and strength were normal. Uvula tongue midline. Head turning and shoulder shrug  were normal and symmetric. Motor: The motor testing reveals 5 over 5 strength of all 4  extremities. Good symmetric motor tone is noted throughout.  Sensory: Sensory testing is intact to soft touch on all 4 extremities. No evidence of extinction is noted.  Coordination: Cerebellar testing reveals good finger-nose-finger and heel-to-shin bilaterally.  Gait and station: Gait is normal. Tandem gait is normal. Romberg is negative. No drift is seen.  Reflexes: Deep tendon reflexes are symmetric and normal bilaterally.   DIAGNOSTIC DATA (LABS, IMAGING, TESTING) - I reviewed patient records, labs, notes, testing and imaging myself where available.  Lab Results  Component Value Date   WBC 10.6 (H) 05/22/2010   HGB 10.8 (L) 05/22/2010   HCT 32.1 (L) 05/22/2010   MCV 89.5 05/22/2010   PLT 476 (H) 05/22/2010      Component Value Date/Time   NA 137 04/23/2010 1950   K 3.9 04/23/2010 1950   CL 100 04/23/2010 1950   GLUCOSE 125 (H) 04/23/2010 1950   BUN 14 04/23/2010 1950   CREATININE 1.3 04/23/2010 1950   No results found for: CHOL, HDL, LDLCALC, LDLDIRECT, TRIG, CHOLHDL No results found for: HGBA1C No results found for: VITAMINB12 No results found for: TSH    ASSESSMENT AND PLAN 81 y.o. year old male  has a past medical history of Arthritis; Degeneration macular; Hearing aid worn; biopsy; tonsillectomy; Prostate cancer ([place]); Spondylosis; and Temporal arteritis ([place]) (12/20/2012). here with:  1. Temporal arteritis  Overall the patient has remained stable. I will check blood work today. If his blood work is normal we will reduce prednisone to 4 mg daily. He will follow-up in 6 months or sooner if needed.     Ward Givens, MSN, NP-C 09/03/2016, 10:32 AM [facility] [address] [place], [place] [address] [phone]

## 2016-09-03 NOTE — Patient Instructions (Signed)
Blood work today If normal- we will decrease prednisone to 4mg  daily If your symptoms worsen or you develop new symptoms please let us know.

## 2016-09-04 LAB — C-REACTIVE PROTEIN: CRP: 47.2 mg/L — ABNORMAL HIGH (ref 0.0–4.9)

## 2016-09-04 LAB — SEDIMENTATION RATE: Sed Rate: 14 mm/hr (ref 0–30)

## 2016-09-07 ENCOUNTER — Telehealth: Payer: Self-pay | Admitting: Adult Health

## 2016-09-07 MED ORDER — PREDNISONE 1 MG PO TABS: 4.0000 mg | ORAL_TABLET | Freq: Every day | ORAL | 11 refills | Status: DC

## 2016-09-07 NOTE — Telephone Encounter (Signed)
I called the patient. He will decrease his of prednisone to 4 mg. We will recheck bloodwork in 3 months.

## 2016-09-07 NOTE — Telephone Encounter (Signed)
Patients wife called requesting lab results. Please call

## 2016-09-08 DIAGNOSIS — M25551 Pain in right hip: Secondary | ICD-10-CM | POA: Diagnosis not present

## 2016-09-08 NOTE — Telephone Encounter (Signed)
[name] did speak with pt. See other phone note.

## 2016-09-14 DIAGNOSIS — M545 Low back pain: Secondary | ICD-10-CM | POA: Diagnosis not present

## 2016-09-14 DIAGNOSIS — M25551 Pain in right hip: Secondary | ICD-10-CM | POA: Diagnosis not present

## 2016-09-22 DIAGNOSIS — J329 Chronic sinusitis, unspecified: Secondary | ICD-10-CM | POA: Diagnosis not present

## 2016-09-28 DIAGNOSIS — M545 Low back pain: Secondary | ICD-10-CM | POA: Diagnosis not present

## 2016-09-28 DIAGNOSIS — M79604 Pain in right leg: Secondary | ICD-10-CM | POA: Diagnosis not present

## 2016-09-28 DIAGNOSIS — M2560 Stiffness of unspecified joint, not elsewhere classified: Secondary | ICD-10-CM | POA: Diagnosis not present

## 2016-10-02 DIAGNOSIS — M79604 Pain in right leg: Secondary | ICD-10-CM | POA: Diagnosis not present

## 2016-10-02 DIAGNOSIS — M545 Low back pain: Secondary | ICD-10-CM | POA: Diagnosis not present

## 2016-10-02 DIAGNOSIS — M2560 Stiffness of unspecified joint, not elsewhere classified: Secondary | ICD-10-CM | POA: Diagnosis not present

## 2016-10-06 DIAGNOSIS — M545 Low back pain: Secondary | ICD-10-CM | POA: Diagnosis not present

## 2016-10-06 DIAGNOSIS — M2560 Stiffness of unspecified joint, not elsewhere classified: Secondary | ICD-10-CM | POA: Diagnosis not present

## 2016-10-06 DIAGNOSIS — M79604 Pain in right leg: Secondary | ICD-10-CM | POA: Diagnosis not present

## 2016-10-07 DIAGNOSIS — M316 Other giant cell arteritis: Secondary | ICD-10-CM | POA: Diagnosis not present

## 2016-10-07 DIAGNOSIS — R0602 Shortness of breath: Secondary | ICD-10-CM | POA: Diagnosis not present

## 2016-10-07 DIAGNOSIS — R001 Bradycardia, unspecified: Secondary | ICD-10-CM | POA: Diagnosis not present

## 2016-10-07 DIAGNOSIS — R002 Palpitations: Secondary | ICD-10-CM | POA: Diagnosis not present

## 2016-10-12 DIAGNOSIS — R0602 Shortness of breath: Secondary | ICD-10-CM | POA: Diagnosis not present

## 2016-10-12 DIAGNOSIS — R001 Bradycardia, unspecified: Secondary | ICD-10-CM | POA: Diagnosis not present

## 2016-10-12 DIAGNOSIS — R002 Palpitations: Secondary | ICD-10-CM | POA: Diagnosis not present

## 2016-10-13 DIAGNOSIS — R0602 Shortness of breath: Secondary | ICD-10-CM | POA: Diagnosis not present

## 2016-10-13 DIAGNOSIS — R002 Palpitations: Secondary | ICD-10-CM | POA: Diagnosis not present

## 2016-10-13 DIAGNOSIS — M316 Other giant cell arteritis: Secondary | ICD-10-CM | POA: Diagnosis not present

## 2016-10-13 DIAGNOSIS — R001 Bradycardia, unspecified: Secondary | ICD-10-CM | POA: Diagnosis not present

## 2016-11-17 DIAGNOSIS — M316 Other giant cell arteritis: Secondary | ICD-10-CM | POA: Diagnosis not present

## 2016-11-17 DIAGNOSIS — R0602 Shortness of breath: Secondary | ICD-10-CM | POA: Diagnosis not present

## 2016-11-17 DIAGNOSIS — R002 Palpitations: Secondary | ICD-10-CM | POA: Diagnosis not present

## 2016-11-17 DIAGNOSIS — R001 Bradycardia, unspecified: Secondary | ICD-10-CM | POA: Diagnosis not present

## 2016-11-18 ENCOUNTER — Telehealth: Payer: Self-pay | Admitting: Adult Health

## 2016-11-18 DIAGNOSIS — Z8249 Family history of ischemic heart disease and other diseases of the circulatory system: Secondary | ICD-10-CM | POA: Diagnosis not present

## 2016-11-18 DIAGNOSIS — M316 Other giant cell arteritis: Secondary | ICD-10-CM

## 2016-11-18 DIAGNOSIS — R079 Chest pain, unspecified: Secondary | ICD-10-CM | POA: Diagnosis not present

## 2016-11-18 DIAGNOSIS — K029 Dental caries, unspecified: Secondary | ICD-10-CM | POA: Diagnosis not present

## 2016-11-18 DIAGNOSIS — R42 Dizziness and giddiness: Secondary | ICD-10-CM | POA: Diagnosis not present

## 2016-11-18 DIAGNOSIS — I493 Ventricular premature depolarization: Secondary | ICD-10-CM | POA: Diagnosis not present

## 2016-11-18 DIAGNOSIS — Z7982 Long term (current) use of aspirin: Secondary | ICD-10-CM | POA: Diagnosis not present

## 2016-11-18 DIAGNOSIS — R000 Tachycardia, unspecified: Secondary | ICD-10-CM | POA: Diagnosis not present

## 2016-11-18 DIAGNOSIS — R51 Headache: Secondary | ICD-10-CM | POA: Diagnosis not present

## 2016-11-18 DIAGNOSIS — Z79899 Other long term (current) drug therapy: Secondary | ICD-10-CM | POA: Diagnosis not present

## 2016-11-18 NOTE — Telephone Encounter (Signed)
-----   Message from Ward Givens, NP sent at 09/07/2016  5:38 PM EST ----- CRP and sed

## 2016-11-18 NOTE — Telephone Encounter (Signed)
Please call patient had him come in for repeat blood work. Orders were placed

## 2016-11-19 DIAGNOSIS — R079 Chest pain, unspecified: Secondary | ICD-10-CM | POA: Diagnosis not present

## 2016-11-19 DIAGNOSIS — I493 Ventricular premature depolarization: Secondary | ICD-10-CM | POA: Diagnosis not present

## 2016-11-19 DIAGNOSIS — M316 Other giant cell arteritis: Secondary | ICD-10-CM | POA: Diagnosis not present

## 2016-11-19 NOTE — Telephone Encounter (Signed)
No answer

## 2016-11-20 ENCOUNTER — Other Ambulatory Visit (INDEPENDENT_AMBULATORY_CARE_PROVIDER_SITE_OTHER): Payer: Self-pay

## 2016-11-20 ENCOUNTER — Encounter (INDEPENDENT_AMBULATORY_CARE_PROVIDER_SITE_OTHER): Payer: Self-pay

## 2016-11-20 DIAGNOSIS — M316 Other giant cell arteritis: Secondary | ICD-10-CM

## 2016-11-20 DIAGNOSIS — Z0289 Encounter for other administrative examinations: Secondary | ICD-10-CM

## 2016-11-20 NOTE — Telephone Encounter (Signed)
I spoke to wife.  She stated that pt has started to have headaches, has some swollen area on L temple and swelling from eyes down, bilateral jaw pain.  He had stress test yesterday and that was ok she says.  Had chills yesterday no fever.  I relayd that MM/NP wanted to have repeat labs and they will come in this am for that.

## 2016-11-20 NOTE — Telephone Encounter (Signed)
I called the patient, left a message. The patient is having new symptoms of headache, sedimentation rate and CRP have been ordered, the patient will need to get the blood work done, we may need to readjust the dose of prednisone if the sedimentation rate is going up.

## 2016-11-21 DIAGNOSIS — M791 Myalgia: Secondary | ICD-10-CM | POA: Diagnosis not present

## 2016-11-21 DIAGNOSIS — R509 Fever, unspecified: Secondary | ICD-10-CM | POA: Diagnosis not present

## 2016-11-21 DIAGNOSIS — J019 Acute sinusitis, unspecified: Secondary | ICD-10-CM | POA: Diagnosis not present

## 2016-11-21 LAB — SEDIMENTATION RATE: Sed Rate: 25 mm/hr (ref 0–30)

## 2016-11-21 LAB — C-REACTIVE PROTEIN: CRP: 231 mg/L — ABNORMAL HIGH (ref 0.0–4.9)

## 2016-11-23 MED ORDER — PREDNISONE 10 MG PO TABS: ORAL_TABLET | ORAL | 1 refills | Status: DC

## 2016-11-23 NOTE — Addendum Note (Signed)
Addended by: Kathrynn Ducking on: 11/23/2016 10:00 AM   Modules accepted: Orders

## 2016-11-23 NOTE — Telephone Encounter (Signed)
Patient's wife calling to get lab results on sedimentation rate.

## 2016-11-23 NOTE — Telephone Encounter (Signed)
I called the patient, talk with the wife. The sedimentation rate remains normal at 25, but the C-reactive protein has gone extremely high at 231. The patient apparently ran fevers, up to 102.3. He went to urgent medical care, they did chest x-ray that was unremarkable,  Had some other blood work but the results are not available to me. He had a CT of the head done at [facility] that reportedly was unremarkable.  I will start prednisone 20 mg daily for 2 weeks, then he will drop back to 10 mg daily, I will get a revisit after that.   I will try to get the CT brain report from hospital.  The wife indicates that he has some facial swelling.

## 2016-11-24 NOTE — Telephone Encounter (Signed)
Patient's wife calling to advise the patient can take the OV appointment at 7:30am with Dr. Jannifer Franklin on 12-07-16 instead of 12-14-16.

## 2016-11-24 NOTE — Telephone Encounter (Signed)
Called and spoke with wife. Offered appt week of 4/16. She states they will be out of town that week. Made appt for 12/14/16 at 12pm, check in 1130am.

## 2016-11-24 NOTE — Telephone Encounter (Signed)
Dr. Willis- FYI 

## 2016-11-24 NOTE — Telephone Encounter (Signed)
I spoke w/ [name] in phone room and advised I already scheduled a pt in that slot after I spoke with them. They will keep appt scheduled for 12/14/16. Did offer 12/10/16 at 730am, they declined. Kept appt for 12/14/16

## 2016-11-25 DIAGNOSIS — I472 Ventricular tachycardia: Secondary | ICD-10-CM | POA: Diagnosis not present

## 2016-11-25 DIAGNOSIS — M316 Other giant cell arteritis: Secondary | ICD-10-CM | POA: Diagnosis not present

## 2016-11-25 DIAGNOSIS — R002 Palpitations: Secondary | ICD-10-CM | POA: Diagnosis not present

## 2016-11-26 DIAGNOSIS — D519 Vitamin B12 deficiency anemia, unspecified: Secondary | ICD-10-CM | POA: Diagnosis not present

## 2016-11-26 DIAGNOSIS — E785 Hyperlipidemia, unspecified: Secondary | ICD-10-CM | POA: Diagnosis not present

## 2016-11-26 DIAGNOSIS — R079 Chest pain, unspecified: Secondary | ICD-10-CM | POA: Diagnosis not present

## 2016-11-26 DIAGNOSIS — M316 Other giant cell arteritis: Secondary | ICD-10-CM | POA: Diagnosis not present

## 2016-11-26 DIAGNOSIS — D649 Anemia, unspecified: Secondary | ICD-10-CM | POA: Diagnosis not present

## 2016-11-26 DIAGNOSIS — I493 Ventricular premature depolarization: Secondary | ICD-10-CM | POA: Diagnosis not present

## 2016-11-26 DIAGNOSIS — R202 Paresthesia of skin: Secondary | ICD-10-CM | POA: Diagnosis not present

## 2016-11-26 DIAGNOSIS — D509 Iron deficiency anemia, unspecified: Secondary | ICD-10-CM | POA: Diagnosis not present

## 2016-11-26 DIAGNOSIS — Z79899 Other long term (current) drug therapy: Secondary | ICD-10-CM | POA: Diagnosis not present

## 2016-12-03 DIAGNOSIS — Z0000 Encounter for general adult medical examination without abnormal findings: Secondary | ICD-10-CM | POA: Diagnosis not present

## 2016-12-03 DIAGNOSIS — Z125 Encounter for screening for malignant neoplasm of prostate: Secondary | ICD-10-CM | POA: Diagnosis not present

## 2016-12-03 DIAGNOSIS — E611 Iron deficiency: Secondary | ICD-10-CM | POA: Diagnosis not present

## 2016-12-04 DIAGNOSIS — R51 Headache: Secondary | ICD-10-CM | POA: Diagnosis not present

## 2016-12-04 DIAGNOSIS — M316 Other giant cell arteritis: Secondary | ICD-10-CM | POA: Diagnosis not present

## 2016-12-04 DIAGNOSIS — E785 Hyperlipidemia, unspecified: Secondary | ICD-10-CM | POA: Diagnosis not present

## 2016-12-04 DIAGNOSIS — Z23 Encounter for immunization: Secondary | ICD-10-CM | POA: Diagnosis not present

## 2016-12-04 DIAGNOSIS — Z0000 Encounter for general adult medical examination without abnormal findings: Secondary | ICD-10-CM | POA: Diagnosis not present

## 2016-12-14 ENCOUNTER — Telehealth: Payer: Self-pay | Admitting: Neurology

## 2016-12-14 ENCOUNTER — Ambulatory Visit (INDEPENDENT_AMBULATORY_CARE_PROVIDER_SITE_OTHER): Payer: Medicare Other | Admitting: Neurology

## 2016-12-14 ENCOUNTER — Encounter: Payer: Self-pay | Admitting: Neurology

## 2016-12-14 ENCOUNTER — Ambulatory Visit
Admission: RE | Admit: 2016-12-14 | Discharge: 2016-12-14 | Disposition: A | Discharge status: Home / Self Care | Payer: Medicare Other | Source: Ambulatory Visit | Attending: Neurology | Admitting: Neurology

## 2016-12-14 VITALS — BP 119/75 | HR 88 | Ht 67.0 in | Wt 164.5 lb

## 2016-12-14 DIAGNOSIS — M316 Other giant cell arteritis: Secondary | ICD-10-CM

## 2016-12-14 DIAGNOSIS — R5081 Fever presenting with conditions classified elsewhere: Secondary | ICD-10-CM

## 2016-12-14 MED ORDER — PREDNISONE 10 MG PO TABS
10.0000 mg | ORAL_TABLET | Freq: Every day | ORAL | 1 refills | Status: DC
Start: 2016-12-14 — End: 2016-12-17

## 2016-12-14 NOTE — Telephone Encounter (Signed)
I called patient. The chest x-ray is unremarkable, I will call when I get the results of the blood work.

## 2016-12-14 NOTE — Patient Instructions (Signed)
   We will get blood work today, check a CXR.

## 2016-12-14 NOTE — Progress Notes (Signed)
Reason for visit: Temporal arteritis  [name] is an 81 y.o. male  History of present illness:  [name] is a 81 year old right-handed white male with a history of temporal arteritis. The patient had been doing quite well for several years, he was tapering down on the prednisone, he had gotten down to 4 mg daily. In January 2018, he began to have malaise and achiness all over the body. Within the last 3 or 4 weeks he has begun running fevers. Fevers have been as high as 102, he has started taking Tylenol on a scheduled basis taking 2 tablets 3 times daily. Even with the Tylenol, he is still running low-grade fevers in the evening hours of around 99 to 100. The patient is also having night sweats, he denies any weight loss. The night sweats have become very frequent and prominent. He is also having a right temporal headache. He reports fatigue. He denies any skin rashes, or joint swelling. He denies any burning when he passes his urine, he does have a chronic dry cough. He denies any exposure to animals large or small. He was given a higher dose of prednisone, this was increased to 20 mg a day for 2 weeks, then cut back to 10 mg a day. The patient indicates that on the higher dose of prednisone there have been no changes in his condition with the headache or with the malaise. He comes back to this office for further evaluation. He claims that he has had routine blood work done with a CBC, comprehensive metabolic profile, and PSA. He denies any new medications that have been added around the time of his symptoms. He has had no vision changes. The sedimentation rate most recently was normal, but the C-reactive protein has gone very high at 231 on 11/20/2016.  Past Medical History:  Diagnosis Date  . Arthritis    temporal  . Degeneration macular   . Hearing aid worn    Right  . Hx of biopsy    temporal artery  . Hx of tonsillectomy   . Prostate cancer ([place])   . Spondylosis    cervical    . Temporal arteritis ([place]) 12/20/2012    Past Surgical History:  Procedure Laterality Date  . radium seed implants for prostate cancer    . TONSILLECTOMY      Family History  Problem Relation Age of Onset  . Stroke Sister   . Seizures Sister   . Heart disease Brother     Social history:  reports that he has never smoked. He has never used smokeless tobacco. He reports that he does not drink alcohol or use drugs.   No Known Allergies  Medications:  Prior to Admission medications   Medication Sig Start Date End Date Taking? Authorizing Provider  aspirin 81 MG tablet Take 81 mg by mouth every other day. One tablet   Yes Historical Provider, MD  B Complex Vitamins (B COMPLEX 50) TABS Take 1 tablet by mouth daily.   Yes Historical Provider, MD  Calcium Carbonate-Vitamin D (CALTRATE 600+D PO) Take by mouth. Twice daily, chew   Yes Historical Provider, MD  cholecalciferol (VITAMIN D) 1000 UNITS tablet Take 1,000 Units by mouth 2 (two) times daily.   Yes Historical Provider, MD  metoprolol tartrate (LOPRESSOR) 25 MG tablet Take 12.5 mg by mouth daily. 11/25/16 12/25/16 Yes Historical Provider, MD  Multiple Vitamins-Minerals (PRESERVISION AREDS 2 PO) Take by mouth 2 (two) times daily.   Yes Historical  Provider, MD  predniSONE (DELTASONE) 10 MG tablet 2 tablets daily for 2 weeks, then take 1 tablet daily 11/23/16  Yes Kathrynn Ducking, MD    ROS:  Out of a complete 14 system review of symptoms, the patient complains only of the following symptoms, and all other reviewed systems are negative.  Chills, fatigue, fever Hearing loss Cough Palpitations of the heart Constipation Headache  Blood pressure 119/75, pulse 88, height 5\' 7"  (1.702 m), weight 164 lb 8 oz (74.6 kg).  Physical Exam  General: The patient is alert and cooperative at the time of the examination.  Skin: No significant peripheral edema is noted.   Neurologic Exam  Mental status: The patient is alert and oriented x  3 at the time of the examination. The patient has apparent normal recent and remote memory, with an apparently normal attention span and concentration ability.   Cranial nerves: Facial symmetry is present. Speech is normal, no aphasia or dysarthria is noted. Extraocular movements are full. Visual fields are full.  Motor: The patient has good strength in all 4 extremities.  Sensory examination: Soft touch sensation is symmetric on the face, arms, and legs.  Coordination: The patient has good finger-nose-finger and heel-to-shin bilaterally.  Gait and station: The patient has a normal gait. Tandem gait is normal. Romberg is negative. No drift is seen.  Reflexes: Deep tendon reflexes are symmetric.   Assessment/Plan:  1. Temporal arteritis  2. Fever of unknown origin  The patient is having fairly significant fevers, night sweats, and malaise. Temporal arteritis generally does not cause high fevers. The patient is still having low-grade fevers even with a scheduled dose of Tylenol. Also, elevating the dose of prednisone did not affect the symptoms. The etiology of the fever is not clear. We will check further blood work today, check a chest x-ray, and a urinalysis. He will follow-up for his next scheduled appointment.  Jill Alexanders MD 12/14/2016 12:41 PM  [facility] [address] [place], [place] [address]  Phone [phone] Fax [phone]

## 2016-12-15 LAB — URINALYSIS, ROUTINE W REFLEX MICROSCOPIC
BILIRUBIN UA: NEGATIVE
GLUCOSE, UA: NEGATIVE
KETONES UA: NEGATIVE
Leukocytes, UA: NEGATIVE
NITRITE UA: NEGATIVE
PH UA: 6.5 (ref 5.0–7.5)
PROTEIN UA: NEGATIVE
RBC UA: NEGATIVE
Specific Gravity, UA: 1.015 (ref 1.005–1.030)
UUROB: 0.2 mg/dL (ref 0.2–1.0)

## 2016-12-16 LAB — QUANTIFERON IN TUBE
QFT TB AG MINUS NIL VALUE: 0 IU/mL
QUANTIFERON MITOGEN VALUE: 0.11 [IU]/mL
QUANTIFERON TB AG VALUE: 0.03 IU/mL
QUANTIFERON TB GOLD: UNDETERMINED — AB
Quantiferon Nil Value: 0.03 IU/mL

## 2016-12-16 LAB — QUANTIFERON TB GOLD ASSAY (BLOOD)

## 2016-12-17 ENCOUNTER — Telehealth: Payer: Self-pay | Admitting: Neurology

## 2016-12-17 DIAGNOSIS — R5381 Other malaise: Secondary | ICD-10-CM

## 2016-12-17 DIAGNOSIS — Z5181 Encounter for therapeutic drug level monitoring: Secondary | ICD-10-CM

## 2016-12-17 DIAGNOSIS — R5383 Other fatigue: Principal | ICD-10-CM

## 2016-12-17 LAB — MULTIPLE MYELOMA PANEL, SERUM
ALBUMIN/GLOB SERPL: 0.8 (ref 0.7–1.7)
ALPHA 1: 0.5 g/dL — AB (ref 0.0–0.4)
ALPHA2 GLOB SERPL ELPH-MCNC: 1.4 g/dL — AB (ref 0.4–1.0)
Albumin SerPl Elph-Mcnc: 3 g/dL (ref 2.9–4.4)
B-GLOBULIN SERPL ELPH-MCNC: 1.4 g/dL — AB (ref 0.7–1.3)
GAMMA GLOB SERPL ELPH-MCNC: 0.7 g/dL (ref 0.4–1.8)
GLOBULIN, TOTAL: 3.9 g/dL (ref 2.2–3.9)
IGG (IMMUNOGLOBIN G), SERUM: 752 mg/dL (ref 700–1600)
IGM (IMMUNOGLOBULIN M), SRM: 49 mg/dL (ref 15–143)
IgA/Immunoglobulin A, Serum: 505 mg/dL — ABNORMAL HIGH (ref 61–437)
Total Protein: 6.9 g/dL (ref 6.0–8.5)

## 2016-12-17 LAB — ANA W/REFLEX: ANA: NEGATIVE

## 2016-12-17 LAB — RHEUMATOID FACTOR: Rhuematoid fact SerPl-aCnc: 12.4 IU/mL (ref 0.0–13.9)

## 2016-12-17 LAB — PAN-ANCA
ANCA Proteinase 3: <3.5 U/mL (ref 0.0–3.5)
Atypical pANCA: <1:20 {titer}
C-ANCA: <1:20 {titer}

## 2016-12-17 LAB — SEDIMENTATION RATE: Sed Rate: 64 mm/hr — ABNORMAL HIGH (ref 0–30)

## 2016-12-17 LAB — ANGIOTENSIN CONVERTING ENZYME: ANGIO CONVERT ENZYME: 38 U/L (ref 14–82)

## 2016-12-17 LAB — CK: Total CK: 34 U/L (ref 24–204)

## 2016-12-17 LAB — C-REACTIVE PROTEIN: CRP: 123.9 mg/L — ABNORMAL HIGH (ref 0.0–4.9)

## 2016-12-17 MED ORDER — PREDNISONE 20 MG PO TABS
40.0000 mg | ORAL_TABLET | Freq: Every day | ORAL | 3 refills | Status: DC
Start: 2016-12-17 — End: 2017-03-08

## 2016-12-17 NOTE — Addendum Note (Signed)
Addended by: Kathrynn Ducking on: 12/17/2016 05:04 PM   Modules accepted: Orders

## 2016-12-17 NOTE — Telephone Encounter (Signed)
Pt's wife called said she will be out of the home today and can be reached on her cell phone ([phone]. She said provider was to call her back with lab results and to discuss if he wanted the pt to see a specialist.

## 2016-12-17 NOTE — Telephone Encounter (Signed)
I called the patient, talk with the wife. The blood work shows ongoing elevation of C-reactive protein, the sedimentation rate is going up, it is now abnormal. This is with a higher dose of prednisone.  The Quantaferon Gold test for tuberculosis was indeterminate, the patient may require a standard PPD test through his primary care physician.  I will increase the prednisone to 40 mg a day, I will make referral to rheumatology with a question of whether or not the patient may have polymyalgia rheumatica as a source of his malaise and low-grade fevers.

## 2016-12-25 DIAGNOSIS — T161XXA Foreign body in right ear, initial encounter: Secondary | ICD-10-CM | POA: Diagnosis not present

## 2016-12-25 DIAGNOSIS — H60501 Unspecified acute noninfective otitis externa, right ear: Secondary | ICD-10-CM | POA: Diagnosis not present

## 2017-01-14 DIAGNOSIS — I472 Ventricular tachycardia: Secondary | ICD-10-CM | POA: Diagnosis not present

## 2017-01-14 DIAGNOSIS — M316 Other giant cell arteritis: Secondary | ICD-10-CM | POA: Diagnosis not present

## 2017-01-14 DIAGNOSIS — R002 Palpitations: Secondary | ICD-10-CM | POA: Diagnosis not present

## 2017-01-14 DIAGNOSIS — R001 Bradycardia, unspecified: Secondary | ICD-10-CM | POA: Diagnosis not present

## 2017-01-14 DIAGNOSIS — R55 Syncope and collapse: Secondary | ICD-10-CM | POA: Diagnosis not present

## 2017-01-25 ENCOUNTER — Other Ambulatory Visit (INDEPENDENT_AMBULATORY_CARE_PROVIDER_SITE_OTHER): Payer: Self-pay

## 2017-01-25 DIAGNOSIS — Z5181 Encounter for therapeutic drug level monitoring: Secondary | ICD-10-CM

## 2017-01-25 DIAGNOSIS — Z0289 Encounter for other administrative examinations: Secondary | ICD-10-CM

## 2017-01-25 NOTE — Telephone Encounter (Signed)
I called the patient. I talk with the wife. Even on 40 mg of prednisone he is still not feeling well, still running low-grade fevers and chills. I will check further blood work at this time, they will be seeing rheumatology at the end of this month.

## 2017-01-25 NOTE — Addendum Note (Signed)
Addended by: Kathrynn Ducking on: 01/25/2017 10:34 AM   Modules accepted: Orders

## 2017-01-25 NOTE — Telephone Encounter (Signed)
Patient's wife calling. Patient felt better when predniSONE (DELTASONE) 20 MG tablet was changed to 40mg  but yesterday the patient started feeling bad and today is worse.Marland Kitchen Please call and discuss.

## 2017-01-26 ENCOUNTER — Telehealth: Payer: Self-pay | Admitting: Neurology

## 2017-01-26 LAB — COMPREHENSIVE METABOLIC PANEL
ALBUMIN: 4.1 g/dL (ref 3.5–4.8)
ALK PHOS: 45 IU/L (ref 39–117)
ALT: 17 IU/L (ref 0–44)
AST: 18 IU/L (ref 0–40)
Albumin/Globulin Ratio: 1.8 (ref 1.2–2.2)
BILIRUBIN TOTAL: 0.4 mg/dL (ref 0.0–1.2)
BUN / CREAT RATIO: 19 (ref 10–24)
BUN: 21 mg/dL (ref 8–27)
CO2: 27 mmol/L (ref 18–29)
CREATININE: 1.1 mg/dL (ref 0.76–1.27)
Calcium: 9.3 mg/dL (ref 8.6–10.2)
Chloride: 97 mmol/L (ref 96–106)
GFR calc Af Amer: 73 mL/min/{1.73_m2} (ref >59)
GFR calc non Af Amer: 64 mL/min/{1.73_m2} (ref >59)
GLUCOSE: 127 mg/dL — AB (ref 65–99)
Globulin, Total: 2.3 g/dL (ref 1.5–4.5)
Potassium: 4.8 mmol/L (ref 3.5–5.2)
Sodium: 140 mmol/L (ref 134–144)
Total Protein: 6.4 g/dL (ref 6.0–8.5)

## 2017-01-26 LAB — CBC WITH DIFFERENTIAL/PLATELET
BASOS ABS: 0 10*3/uL (ref 0.0–0.2)
Basos: 0 %
EOS (ABSOLUTE): 0 10*3/uL (ref 0.0–0.4)
EOS: 0 %
HEMOGLOBIN: 12.4 g/dL — AB (ref 13.0–17.7)
Hematocrit: 38.5 % (ref 37.5–51.0)
IMMATURE GRANULOCYTES: 0 %
Immature Grans (Abs): 0 10*3/uL (ref 0.0–0.1)
LYMPHS ABS: 0.4 10*3/uL — AB (ref 0.7–3.1)
Lymphs: 4 %
MCH: 28.8 pg (ref 26.6–33.0)
MCHC: 32.2 g/dL (ref 31.5–35.7)
MCV: 90 fL (ref 79–97)
MONOCYTES: 2 %
Monocytes Absolute: 0.1 10*3/uL (ref 0.1–0.9)
NEUTROS PCT: 94 %
Neutrophils Absolute: 8.3 10*3/uL — ABNORMAL HIGH (ref 1.4–7.0)
Platelets: 186 10*3/uL (ref 150–379)
RBC: 4.3 x10E6/uL (ref 4.14–5.80)
RDW: 16.9 % — AB (ref 12.3–15.4)
WBC: 8.9 10*3/uL (ref 3.4–10.8)

## 2017-01-26 LAB — SEDIMENTATION RATE: SED RATE: 15 mm/h (ref 0–30)

## 2017-01-26 LAB — C-REACTIVE PROTEIN: CRP: 22.8 mg/L — AB (ref 0.0–4.9)

## 2017-01-26 NOTE — Telephone Encounter (Signed)
I called the patient, talk with the wife. Sedimentation rate is now normal, the C-reactive protein has markedly improved but is still elevated. The rest of the blood work is unremarkable exception of a mild anemia.  The patient was running a fever 102 yesterday on prednisone. He has had a prior chest x-ray and urinalysis that were unremarkable. There was an indeterminate Quantiferon test for TB, a PPD was done by the primary care doctor which was negative.  I am concerned that we are missing the source of the fevers, I would wonder whether a full workup to include a CT of the chest, abdomen, and pelvis should be done, the wife will contact the primary care physician and inform him of these fevers.

## 2017-01-26 NOTE — Telephone Encounter (Signed)
Patients wife called office to ask Dr. Jannifer Franklin if patient is to continue on 40mg  of prednisone or reduce it?  Please call

## 2017-01-26 NOTE — Telephone Encounter (Signed)
This patient is to stay on the prednisone for now until the rheumatology evaluation. Once again, I indicated that further workup may be indicated to look for source of inflammation.

## 2017-01-29 DIAGNOSIS — R509 Fever, unspecified: Secondary | ICD-10-CM | POA: Diagnosis not present

## 2017-01-29 DIAGNOSIS — J189 Pneumonia, unspecified organism: Secondary | ICD-10-CM | POA: Diagnosis not present

## 2017-01-29 DIAGNOSIS — K5900 Constipation, unspecified: Secondary | ICD-10-CM | POA: Diagnosis not present

## 2017-01-29 DIAGNOSIS — R1032 Left lower quadrant pain: Secondary | ICD-10-CM | POA: Diagnosis not present

## 2017-01-29 DIAGNOSIS — R140 Abdominal distension (gaseous): Secondary | ICD-10-CM | POA: Diagnosis not present

## 2017-02-02 DIAGNOSIS — H353131 Nonexudative age-related macular degeneration, bilateral, early dry stage: Secondary | ICD-10-CM | POA: Diagnosis not present

## 2017-02-04 DIAGNOSIS — I700 Atherosclerosis of aorta: Secondary | ICD-10-CM | POA: Diagnosis not present

## 2017-02-04 DIAGNOSIS — R509 Fever, unspecified: Secondary | ICD-10-CM | POA: Diagnosis not present

## 2017-02-04 DIAGNOSIS — J189 Pneumonia, unspecified organism: Secondary | ICD-10-CM | POA: Diagnosis not present

## 2017-02-04 DIAGNOSIS — R1032 Left lower quadrant pain: Secondary | ICD-10-CM | POA: Diagnosis not present

## 2017-02-18 DIAGNOSIS — M316 Other giant cell arteritis: Secondary | ICD-10-CM | POA: Diagnosis not present

## 2017-02-18 DIAGNOSIS — H1131 Conjunctival hemorrhage, right eye: Secondary | ICD-10-CM | POA: Diagnosis not present

## 2017-02-18 DIAGNOSIS — Z7952 Long term (current) use of systemic steroids: Secondary | ICD-10-CM | POA: Diagnosis not present

## 2017-02-22 ENCOUNTER — Telehealth: Payer: Self-pay | Admitting: Neurology

## 2017-02-22 NOTE — Telephone Encounter (Signed)
The patient recently was seen by Dr. Amil Amen, he felt that the elevation in sedimentation rate and the generalized malaise was associated with temporal arteritis, and he agreed with prednisone elevation with a slow taper, he added methotrexate to the regimen, there is a new agent called Actemra that has an indication for temporal arteritis, but this is very expensive.  We will continue on prednisone and methotrexate for now, he was started on 15 mg a week of methotrexate.

## 2017-03-03 DIAGNOSIS — I472 Ventricular tachycardia: Secondary | ICD-10-CM | POA: Diagnosis not present

## 2017-03-03 DIAGNOSIS — R55 Syncope and collapse: Secondary | ICD-10-CM | POA: Diagnosis not present

## 2017-03-03 DIAGNOSIS — R001 Bradycardia, unspecified: Secondary | ICD-10-CM | POA: Diagnosis not present

## 2017-03-03 DIAGNOSIS — I2510 Atherosclerotic heart disease of native coronary artery without angina pectoris: Secondary | ICD-10-CM | POA: Diagnosis not present

## 2017-03-04 ENCOUNTER — Ambulatory Visit (INDEPENDENT_AMBULATORY_CARE_PROVIDER_SITE_OTHER): Payer: Medicare Other | Admitting: Adult Health

## 2017-03-04 ENCOUNTER — Encounter: Payer: Self-pay | Admitting: Adult Health

## 2017-03-04 VITALS — BP 120/63 | HR 73 | Wt 165.6 lb

## 2017-03-04 DIAGNOSIS — M316 Other giant cell arteritis: Secondary | ICD-10-CM

## 2017-03-04 NOTE — Patient Instructions (Signed)
Your Plan:  Continue Prednisone 40 mg daily Will recheck sedimentation rate and CRP If your symptoms worsen or you develop new symptoms please let us know.    Thank you for coming to see [place] at [facility]. I hope we have been able to provide you high quality care today.  You may receive a patient satisfaction survey over the next few weeks. We would appreciate your feedback and comments so that we may continue to improve ourselves and the health of our patients.

## 2017-03-04 NOTE — Progress Notes (Signed)
PATIENT: [name] DOB: 1935/09/10  REASON FOR VISIT: follow up-temporal arteritis HISTORY FROM: patient  HISTORY OF PRESENT ILLNESS: [name] is a 81 year old male with a history of temporal arteritis. He returns today for follow-up. He continues on prednisone 40 mg daily. He was started on methotrexate by Dr. Amil Amen. He has been taking this for 2 weeks now. The patient states that his headaches have decreased in frequency. He still has episodes of sharp shooting pain in the right temporal region that last for only a few seconds. He states that this occurs approximately every 8 hours. He is also noticed some ongoing blurry vision. He did follow up with his optometrist and the patient reports that his exam was normal. I have not seen this report. The patient states that his fevers have subsided. He denies any new neurological symptoms. He returns today for an evaluation.  HISTORY 12/14/16: Mr. [name] is a 81 year old right-handed white male with a history of temporal arteritis. The patient had been doing quite well for several years, he was tapering down on the prednisone, he had gotten down to 4 mg daily. In January 2018, he began to have malaise and achiness all over the body. Within the last 3 or 4 weeks he has begun running fevers. Fevers have been as high as 102, he has started taking Tylenol on a scheduled basis taking 2 tablets 3 times daily. Even with the Tylenol, he is still running low-grade fevers in the evening hours of around 99 to 100. The patient is also having night sweats, he denies any weight loss. The night sweats have become very frequent and prominent. He is also having a right temporal headache. He reports fatigue. He denies any skin rashes, or joint swelling. He denies any burning when he passes his urine, he does have a chronic dry cough. He denies any exposure to animals large or small. He was given a higher dose of prednisone, this was increased to 20 mg a day for 2  weeks, then cut back to 10 mg a day. The patient indicates that on the higher dose of prednisone there have been no changes in his condition with the headache or with the malaise. He comes back to this office for further evaluation. He claims that he has had routine blood work done with a CBC, comprehensive metabolic profile, and PSA. He denies any new medications that have been added around the time of his symptoms. He has had no vision changes. The sedimentation rate most recently was normal, but the C-reactive protein has gone very high at 231 on 11/20/2016.  REVIEW OF SYSTEMS: Out of a complete 14 system review of symptoms, the patient complains only of the following symptoms, and all other reviewed systems are negative.  Constipation, blurred vision, fatigue, hearing loss  ALLERGIES: Allergies  Allergen Reactions  . Metoprolol Swelling    swelling of legs    HOME MEDICATIONS: Outpatient Medications Prior to Visit  Medication Sig Dispense Refill  . aspirin 81 MG tablet Take 81 mg by mouth every other day. One tablet    . B Complex Vitamins (B COMPLEX 50) TABS Take 1 tablet by mouth daily.    . Calcium Carbonate-Vitamin D (CALTRATE 600+D PO) Take by mouth. Twice daily, chew    . cholecalciferol (VITAMIN D) 1000 UNITS tablet Take 1,000 Units by mouth 2 (two) times daily.    . Multiple Vitamins-Minerals (PRESERVISION AREDS 2 PO) Take by mouth 2 (two) times daily.    Marland Kitchen  predniSONE (DELTASONE) 20 MG tablet Take 2 tablets (40 mg total) by mouth daily. 60 tablet 3  . metoprolol tartrate (LOPRESSOR) 25 MG tablet Take 12.5 mg by mouth daily.     No facility-administered medications prior to visit.     PAST MEDICAL HISTORY: Past Medical History:  Diagnosis Date  . Arthritis    temporal  . Degeneration macular   . Hearing aid worn    Right  . Hx of biopsy    temporal artery  . Hx of tonsillectomy   . Prostate cancer ([place])   . Spondylosis    cervical   . Temporal arteritis ([place])  12/20/2012    PAST SURGICAL HISTORY: Past Surgical History:  Procedure Laterality Date  . radium seed implants for prostate cancer    . TONSILLECTOMY      FAMILY HISTORY: Family History  Problem Relation Age of Onset  . Stroke Sister   . Seizures Sister   . Heart disease Brother     SOCIAL HISTORY: Social History   Social History  . Marital status: Married    Spouse name: N/A  . Number of children: 2  . Years of education: hs   Occupational History  . Retired    Social History Main Topics  . Smoking status: Never Smoker  . Smokeless tobacco: Never Used  . Alcohol use No  . Drug use: No  . Sexual activity: Not on file   Other Topics Concern  . Not on file   Social History Narrative   Lives at home w/ his wife   Patient is right handed.   Patient drinks "very little" caffeine.      PHYSICAL EXAM  Vitals:   03/04/17 1008  BP: 120/63  Pulse: 73  Weight: 165 lb 9.6 oz (75.1 kg)   Body mass index is 25.94 kg/m.  Generalized: Well developed, in no acute distress   Neurological examination  Mentation: Alert oriented to time, place, history taking. Follows all commands speech and language fluent Cranial nerve II-XII: Pupils were equal round reactive to light. Extraocular movements were full, visual field were full on confrontational test. Facial sensation and strength were normal. Uvula tongue midline. Head turning and shoulder shrug  were normal and symmetric. Motor: The motor testing reveals 5 over 5 strength of all 4 extremities. Good symmetric motor tone is noted throughout.  Sensory: Sensory testing is intact to soft touch on all 4 extremities. No evidence of extinction is noted.  Coordination: Cerebellar testing reveals good finger-nose-finger and heel-to-shin bilaterally.  Gait and station: Gait is normal. Tandem gait is normal. Romberg is negative. No drift is seen.  Reflexes: Deep tendon reflexes are symmetric and normal bilaterally.   DIAGNOSTIC  DATA (LABS, IMAGING, TESTING) - I reviewed patient records, labs, notes, testing and imaging myself where available.  Lab Results  Component Value Date   WBC 8.9 01/25/2017   HGB 12.4 (L) 01/25/2017   HCT 38.5 01/25/2017   MCV 90 01/25/2017   PLT 186 01/25/2017      Component Value Date/Time   NA 140 01/25/2017 1128   K 4.8 01/25/2017 1128   CL 97 01/25/2017 1128   CO2 27 01/25/2017 1128   GLUCOSE 127 (H) 01/25/2017 1128   GLUCOSE 125 (H) 04/23/2010 1950   BUN 21 01/25/2017 1128   CREATININE 1.10 01/25/2017 1128   CALCIUM 9.3 01/25/2017 1128   PROT 6.4 01/25/2017 1128   ALBUMIN 4.1 01/25/2017 1128   AST 18 01/25/2017 1128  ALT 17 01/25/2017 1128   ALKPHOS 45 01/25/2017 1128   BILITOT 0.4 01/25/2017 1128   GFRNONAA 64 01/25/2017 1128   GFRAA 73 01/25/2017 1128      ASSESSMENT AND PLAN 81 y.o. year old male  has a past medical history of Arthritis; Degeneration macular; Hearing aid worn; biopsy; tonsillectomy; Prostate cancer ([place]); Spondylosis; and Temporal arteritis ([place]) (12/20/2012). here with:  1. Temporal arteritis  The patient will continue on prednisone 40 mg daily as well as methotrexate. The patient is requesting decrease the prednisone however I would like to check a sedimentation rate and CRP level first. Patient is amenable to this plan. Patient is advised that if he has any sudden changes in his vision or his headache frequency increases he should let us know. He will follow-up in 3 months or sooner if needed.  I spent 15 minutes with the patient. 50% of this time was spent discussing his medication      Ward Givens, MSN, NP-C 03/04/2017, 10:13 AM [facility] [address] [place], [place] [address] [phone]

## 2017-03-04 NOTE — Progress Notes (Signed)
I have read the note, and I agree with the clinical assessment and plan.  WILLIS,CHARLES KEITH   

## 2017-03-05 DIAGNOSIS — D649 Anemia, unspecified: Secondary | ICD-10-CM | POA: Diagnosis not present

## 2017-03-05 DIAGNOSIS — M316 Other giant cell arteritis: Secondary | ICD-10-CM | POA: Diagnosis not present

## 2017-03-05 DIAGNOSIS — E611 Iron deficiency: Secondary | ICD-10-CM | POA: Diagnosis not present

## 2017-03-05 DIAGNOSIS — E785 Hyperlipidemia, unspecified: Secondary | ICD-10-CM | POA: Diagnosis not present

## 2017-03-05 DIAGNOSIS — Z79899 Other long term (current) drug therapy: Secondary | ICD-10-CM | POA: Diagnosis not present

## 2017-03-05 LAB — C-REACTIVE PROTEIN: CRP: 2.3 mg/L (ref 0.0–4.9)

## 2017-03-05 LAB — SEDIMENTATION RATE: SED RATE: 11 mm/h (ref 0–30)

## 2017-03-08 ENCOUNTER — Telehealth: Payer: Self-pay | Admitting: Adult Health

## 2017-03-08 MED ORDER — PREDNISONE 20 MG PO TABS: 30.0000 mg | ORAL_TABLET | Freq: Every day | ORAL | 3 refills | Status: DC

## 2017-03-08 MED ORDER — PREDNISONE 20 MG PO TABS: 30.0000 mg | ORAL_TABLET | Freq: Every day | ORAL | 5 refills | Status: DC

## 2017-03-08 NOTE — Telephone Encounter (Signed)
Pt has called back in asking to speak with NP Jinny Blossom, please call at [phone]

## 2017-03-08 NOTE — Telephone Encounter (Signed)
I called patient and left a message for him to call our office.  I will speak to him when he calls back.  I consulted with Dr. Jannifer Franklin- patient's CRP and sedimentation rate removal range. We will decrease prednisone down to 30 mg daily. If his symptoms worsen or he develops new symptoms he should let us know

## 2017-03-08 NOTE — Telephone Encounter (Signed)
I called the patient and relayed the message. New prescription was sent in.

## 2017-03-08 NOTE — Addendum Note (Signed)
Addended by: Trudie Buckler on: 03/08/2017 05:00 PM   Modules accepted: Orders

## 2017-04-11 ENCOUNTER — Other Ambulatory Visit: Payer: Self-pay | Admitting: Neurology

## 2017-04-19 ENCOUNTER — Telehealth: Payer: Self-pay | Admitting: Adult Health

## 2017-04-19 DIAGNOSIS — M316 Other giant cell arteritis: Secondary | ICD-10-CM

## 2017-04-19 NOTE — Addendum Note (Signed)
Addended by: Trudie Buckler on: 04/19/2017 04:44 PM   Modules accepted: Orders

## 2017-04-19 NOTE — Telephone Encounter (Signed)
Patient called office stating he is to have labs drawn and he hasn't heard anything when he is to have them drawn.  Please call

## 2017-04-19 NOTE — Telephone Encounter (Signed)
Labwork placed.  

## 2017-04-19 NOTE — Telephone Encounter (Signed)
Spoke to pt and he will come by and have the sed rate and crp drawn.  Gave him hours of our lab, with no appt necessary.  He verbalized understanding.   He is doing fine.

## 2017-04-19 NOTE — Telephone Encounter (Signed)
Last prednisone dose change was 03-08-17 to 30mg  po daily.   Is asking about repeat labs?

## 2017-04-21 ENCOUNTER — Other Ambulatory Visit (INDEPENDENT_AMBULATORY_CARE_PROVIDER_SITE_OTHER): Payer: Self-pay

## 2017-04-21 DIAGNOSIS — Z7952 Long term (current) use of systemic steroids: Secondary | ICD-10-CM | POA: Diagnosis not present

## 2017-04-21 DIAGNOSIS — M316 Other giant cell arteritis: Secondary | ICD-10-CM | POA: Diagnosis not present

## 2017-04-21 DIAGNOSIS — Z0289 Encounter for other administrative examinations: Secondary | ICD-10-CM

## 2017-04-22 LAB — C-REACTIVE PROTEIN: CRP: 4 mg/L (ref 0.0–4.9)

## 2017-04-22 LAB — SEDIMENTATION RATE: SED RATE: 8 mm/h (ref 0–30)

## 2017-04-27 ENCOUNTER — Telehealth: Payer: Self-pay | Admitting: Adult Health

## 2017-04-27 MED ORDER — PREDNISONE 20 MG PO TABS: 20.0000 mg | ORAL_TABLET | Freq: Every day | ORAL | 3 refills | Status: DC

## 2017-04-27 NOTE — Telephone Encounter (Signed)
Pt's wife called request lab results

## 2017-04-27 NOTE — Telephone Encounter (Signed)
Called wife, on [place] and informed her per NP patient's labs are normal. Advised he needs to reduce prednisone to take 20 mg daily. Wife repeated instructions correctly, verbalized understanding, appreciation.

## 2017-04-27 NOTE — Telephone Encounter (Signed)
Please see other phone note

## 2017-04-27 NOTE — Telephone Encounter (Signed)
Lab work is normal. Please reduce prednisone to 20 mg daily. If any symptoms reoccur please call the office.

## 2017-04-28 ENCOUNTER — Telehealth: Payer: Self-pay | Admitting: Adult Health

## 2017-04-28 NOTE — Telephone Encounter (Signed)
I received laboratory from PCP  White blood cell 11.1-H Hemoglobin 12.7-L Platelet-17.1 H BUN 36 H Creatinine 1.43 H GFR 46 H

## 2017-06-07 ENCOUNTER — Telehealth: Payer: Self-pay | Admitting: Adult Health

## 2017-06-07 DIAGNOSIS — M316 Other giant cell arteritis: Secondary | ICD-10-CM

## 2017-06-07 NOTE — Telephone Encounter (Signed)
Needs repeat sed rate and CRP. Please call patient. He can come in anytime to have this done. Orders placed.

## 2017-06-07 NOTE — Addendum Note (Signed)
Addended by: Trudie Buckler on: 06/07/2017 04:56 PM   Modules accepted: Orders

## 2017-06-07 NOTE — Telephone Encounter (Signed)
Patient's wife is calling to see if an order can be put in for the patient to have lab to check his sed rate and CRP rate.

## 2017-06-07 NOTE — Telephone Encounter (Signed)
Called and spoke with pt's wife, [name] (on [place] form). She is aware that the lab orders have been placed and that he can come in anytime to have them drawn. She had no further questions.

## 2017-06-08 ENCOUNTER — Other Ambulatory Visit (INDEPENDENT_AMBULATORY_CARE_PROVIDER_SITE_OTHER): Payer: Self-pay

## 2017-06-08 DIAGNOSIS — M316 Other giant cell arteritis: Secondary | ICD-10-CM

## 2017-06-08 DIAGNOSIS — Z0289 Encounter for other administrative examinations: Secondary | ICD-10-CM

## 2017-06-09 ENCOUNTER — Telehealth: Payer: Self-pay | Admitting: Adult Health

## 2017-06-09 LAB — C-REACTIVE PROTEIN: CRP: 8.2 mg/L — ABNORMAL HIGH (ref 0.0–4.9)

## 2017-06-09 LAB — SEDIMENTATION RATE: Sed Rate: 10 mm/hr (ref 0–30)

## 2017-06-09 MED ORDER — PREDNISONE 5 MG PO TABS: 7.5000 mg | ORAL_TABLET | Freq: Every day | ORAL | 3 refills | Status: DC

## 2017-06-09 MED ORDER — PREDNISONE 10 MG PO TABS: 10.0000 mg | ORAL_TABLET | Freq: Every day | ORAL | 3 refills | Status: DC

## 2017-06-09 NOTE — Telephone Encounter (Signed)
I called the patient and spoke to his wife. She reports that he has not had any recurrent symptoms. No headache. She does report that when prednisone was initially increased to the higher dose he did note some changes with his vision. The patient's sedimentation rate is in normal range. His CRP is slightly elevated. I will decrease prednisone down to 17.5 mg daily. Advised that if his symptoms recur they should let us know immediately.

## 2017-06-21 ENCOUNTER — Ambulatory Visit (INDEPENDENT_AMBULATORY_CARE_PROVIDER_SITE_OTHER): Payer: Medicare Other | Admitting: Adult Health

## 2017-06-21 ENCOUNTER — Encounter (INDEPENDENT_AMBULATORY_CARE_PROVIDER_SITE_OTHER): Payer: Self-pay

## 2017-06-21 ENCOUNTER — Encounter: Payer: Self-pay | Admitting: Adult Health

## 2017-06-21 DIAGNOSIS — M316 Other giant cell arteritis: Secondary | ICD-10-CM

## 2017-06-21 NOTE — Progress Notes (Signed)
PATIENT: [name] DOB: 03-08-1936  REASON FOR VISIT: follow up-temporal arteritis HISTORY FROM: patient  HISTORY OF PRESENT ILLNESS: Today 06/21/17 [name] is a 81 year old male with a history of temporal arteritis. He returns today for follow-up. The patient recently had blood work and his prednisone was decreased to 17.5 mg daily. He reports that since we decreased the prednisone he's been having intermittent headaches typically in the temporal regions. He reports that the headache is very brief, lasting for only seconds. He denies any visual changes. He reports he does have macular degeneration. Denies fever. Denies jaw claudication. He remains on methotrexate. He returns today for an evaluation.  HISTORY 03/04/17: [name] is a 81 year old male with a history of temporal arteritis. He returns today for follow-up. He continues on prednisone 40 mg daily. He was started on methotrexate by Dr. Amil Huynh. He has been taking this for 2 weeks now. The patient states that his headaches have decreased in frequency. He still has episodes of sharp shooting pain in the right temporal region that last for only a few seconds. He states that this occurs approximately every 8 hours. He is also noticed some ongoing blurry vision. He did follow up with his optometrist and the patient reports that his exam was normal. I have not seen this report. The patient states that his fevers have subsided. He denies any new neurological symptoms. He returns today for an evaluation  REVIEW OF SYSTEMS: Out of a complete 14 system review of symptoms, the patient complains only of the following symptoms, and all other reviewed systems are negative.  Fatigue, blurred vision, hearing loss, headache, joint pain,  ALLERGIES: Allergies  Allergen Reactions  . Metoprolol Swelling    swelling of legs    HOME MEDICATIONS: Outpatient Medications Prior to Visit  Medication Sig Dispense Refill  . aspirin 81 MG  tablet Take 81 mg by mouth every other day. One tablet    . B Complex Vitamins (B COMPLEX 50) TABS Take 1 tablet by mouth daily.    . Calcium Carbonate-Vitamin D (CALTRATE 600+D PO) Take by mouth. Twice daily, chew    . cholecalciferol (VITAMIN D) 1000 UNITS tablet Take 1,000 Units by mouth 2 (two) times daily.    . folic acid (FOLVITE) 1 MG tablet Take 1 mg by mouth daily.  11  . methotrexate (RHEUMATREX) 2.5 MG tablet 2.5 mg.  3  . Multiple Vitamins-Minerals (PRESERVISION AREDS 2 PO) Take by mouth 2 (two) times daily.    . predniSONE (DELTASONE) 10 MG tablet Take 1 tablet (10 mg total) by mouth daily with breakfast. 30 tablet 3  . predniSONE (DELTASONE) 5 MG tablet Take 1.5 tablets (7.5 mg total) by mouth daily with breakfast. 45 tablet 3  . metoprolol tartrate (LOPRESSOR) 25 MG tablet Take 12.5 mg by mouth daily.     No facility-administered medications prior to visit.     PAST MEDICAL HISTORY: Past Medical History:  Diagnosis Date  . Arthritis    temporal  . Degeneration macular   . Hearing aid worn    Right  . Hx of biopsy    temporal artery  . Hx of tonsillectomy   . Prostate cancer ([place])   . Spondylosis    cervical   . Temporal arteritis ([place]) 12/20/2012    PAST SURGICAL HISTORY: Past Surgical History:  Procedure Laterality Date  . radium seed implants for prostate cancer    . TONSILLECTOMY      FAMILY HISTORY: Family  History  Problem Relation Age of Onset  . Stroke Sister   . Seizures Sister   . Heart disease Brother     SOCIAL HISTORY: Social History   Social History  . Marital status: Married    Spouse name: N/A  . Number of children: 2  . Years of education: hs   Occupational History  . Retired    Social History Main Topics  . Smoking status: Never Smoker  . Smokeless tobacco: Never Used  . Alcohol use No  . Drug use: No  . Sexual activity: Not on file   Other Topics Concern  . Not on file   Social History Narrative   Lives at home w/ his  wife   Patient is right handed.   Patient drinks "very little" caffeine.      PHYSICAL EXAM  There were no vitals filed for this visit. There is no height or weight on file to calculate BMI.  Generalized: Well developed, in no acute distress   Neurological examination  Mentation: Alert oriented to time, place, history taking. Follows all commands speech and language fluent Cranial nerve II-XII: Pupils were equal round reactive to light. Extraocular movements were full, visual field were full on confrontational test. Facial sensation and strength were normal. Uvula tongue midline. Head turning and shoulder shrug  were normal and symmetric. Motor: The motor testing reveals 5 over 5 strength of all 4 extremities. Good symmetric motor tone is noted throughout.  Sensory: Sensory testing is intact to soft touch on all 4 extremities. No evidence of extinction is noted.  Coordination: Cerebellar testing reveals good finger-nose-finger and heel-to-shin bilaterally.  Gait and station: Gait is normal. Tandem gait is normal. Romberg is negative. No drift is seen.  Reflexes: Deep tendon reflexes are symmetric and normal bilaterally.   DIAGNOSTIC DATA (LABS, IMAGING, TESTING) - I reviewed patient records, labs, notes, testing and imaging myself where available.  Lab Results  Component Value Date   WBC 8.9 01/25/2017   HGB 12.4 (L) 01/25/2017   HCT 38.5 01/25/2017   MCV 90 01/25/2017   PLT 186 01/25/2017      Component Value Date/Time   NA 140 01/25/2017 1128   K 4.8 01/25/2017 1128   CL 97 01/25/2017 1128   CO2 27 01/25/2017 1128   GLUCOSE 127 (H) 01/25/2017 1128   GLUCOSE 125 (H) 04/23/2010 1950   BUN 21 01/25/2017 1128   CREATININE 1.10 01/25/2017 1128   CALCIUM 9.3 01/25/2017 1128   PROT 6.4 01/25/2017 1128   ALBUMIN 4.1 01/25/2017 1128   AST 18 01/25/2017 1128   ALT 17 01/25/2017 1128   ALKPHOS 45 01/25/2017 1128   BILITOT 0.4 01/25/2017 1128   GFRNONAA 64 01/25/2017 1128    GFRAA 73 01/25/2017 1128      ASSESSMENT AND PLAN 81 y.o. year old male  has a past medical history of Arthritis; Degeneration macular; Hearing aid worn; biopsy; tonsillectomy; Prostate cancer ([place]); Spondylosis; and Temporal arteritis ([place]) (12/20/2012). here with:  1. Temporal Arteritis   The patient reports that he is noticed increase in his headaches although they are very brief since we decreased the prednisone. I will recheck a sedimentation rate and CRP. For now he will remain on prednisone 17.5 mg. He is advised that if his symptoms worsen or he develops new symptoms he should let us know. He will follow-up in 6 months or sooner if needed.     Ward Givens, MSN, NP-C 06/21/2017, 10:28 AM [facility]  [address], [place], [place] [address] [phone]

## 2017-06-21 NOTE — Patient Instructions (Signed)
Your Plan:  Continue Prednisone 17.5 mg daily Blood work today If your symptoms worsen or you develop new symptoms please let us know.   Thank you for coming to see [place] at [facility]. I hope we have been able to provide you high quality care today.  You may receive a patient satisfaction survey over the next few weeks. We would appreciate your feedback and comments so that we may continue to improve ourselves and the health of our patients.

## 2017-06-22 ENCOUNTER — Telehealth: Payer: Self-pay | Admitting: Adult Health

## 2017-06-22 LAB — C-REACTIVE PROTEIN: CRP: 7.7 mg/L — AB (ref 0.0–4.9)

## 2017-06-22 LAB — SEDIMENTATION RATE: Sed Rate: 5 mm/hr (ref 0–30)

## 2017-06-22 NOTE — Telephone Encounter (Signed)
I called the patient. Advised that a sedimentation rate is in normal range. CRP has decreased slightly from 2 weeks ago. He will remain on prednisone 17.5 mg. They will give me a call in 2 weeks. If he has remained stable we will decrease prednisone to 15 mg.

## 2017-06-22 NOTE — Progress Notes (Signed)
I have reviewed and agreed above plan. 

## 2017-07-12 MED ORDER — PREDNISONE 5 MG PO TABS: 5.0000 mg | ORAL_TABLET | Freq: Every day | ORAL | 3 refills | Status: DC

## 2017-07-12 NOTE — Telephone Encounter (Signed)
Please advise patient/wife that he can reduce prednisone to 15 mg daily.

## 2017-07-12 NOTE — Telephone Encounter (Addendum)
Spoke with wife, [name] on [place] and advised her [name] stated he can reduce Prednisone to 15 mg daily. [name] verbalized understanding. She then asked if [name] will want to repeat labs in 6 weeks. This RN stated will discuss with [name] and call her back. She verbalized understanding, appreciation.  Discussed with [name] and called wife to advise [name] will repeat labs in 6 weeks. Patient does not need appt. [name] verbalized understanding, appreciation.

## 2017-07-12 NOTE — Addendum Note (Signed)
Addended by: Trudie Buckler on: 07/12/2017 01:28 PM   Modules accepted: Orders

## 2017-07-12 NOTE — Telephone Encounter (Signed)
Patient's wife calling stating the patient did fine on the 17.5mg  of Prednisone. Please call to discuss change in mg.

## 2017-07-23 DIAGNOSIS — M316 Other giant cell arteritis: Secondary | ICD-10-CM | POA: Diagnosis not present

## 2017-07-23 DIAGNOSIS — Z7952 Long term (current) use of systemic steroids: Secondary | ICD-10-CM | POA: Diagnosis not present

## 2017-08-05 DIAGNOSIS — H26491 Other secondary cataract, right eye: Secondary | ICD-10-CM | POA: Diagnosis not present

## 2017-08-05 DIAGNOSIS — H5203 Hypermetropia, bilateral: Secondary | ICD-10-CM | POA: Diagnosis not present

## 2017-08-10 DIAGNOSIS — H35371 Puckering of macula, right eye: Secondary | ICD-10-CM | POA: Diagnosis not present

## 2017-08-10 DIAGNOSIS — H26492 Other secondary cataract, left eye: Secondary | ICD-10-CM | POA: Diagnosis not present

## 2017-08-19 ENCOUNTER — Telehealth: Payer: Self-pay | Admitting: Adult Health

## 2017-08-19 DIAGNOSIS — M316 Other giant cell arteritis: Secondary | ICD-10-CM

## 2017-08-19 NOTE — Telephone Encounter (Signed)
LMVM for pt that he can come in for labs Friday (we close at 1200) or next week Wed thru Fri.  If any additional questions he can call us back.

## 2017-08-19 NOTE — Telephone Encounter (Signed)
Pt said he is need blood work done and is wanting a call back

## 2017-08-20 ENCOUNTER — Other Ambulatory Visit (INDEPENDENT_AMBULATORY_CARE_PROVIDER_SITE_OTHER): Payer: Self-pay

## 2017-08-20 DIAGNOSIS — M316 Other giant cell arteritis: Secondary | ICD-10-CM | POA: Diagnosis not present

## 2017-08-20 DIAGNOSIS — Z0289 Encounter for other administrative examinations: Secondary | ICD-10-CM

## 2017-08-21 LAB — SEDIMENTATION RATE: SED RATE: 2 mm/h (ref 0–30)

## 2017-08-21 LAB — C-REACTIVE PROTEIN: CRP: 4.8 mg/L (ref 0.0–4.9)

## 2017-08-25 ENCOUNTER — Telehealth: Payer: Self-pay | Admitting: Adult Health

## 2017-08-25 MED ORDER — PREDNISONE 5 MG PO TABS: 2.5000 mg | ORAL_TABLET | Freq: Every day | ORAL | 3 refills | Status: DC

## 2017-08-25 NOTE — Telephone Encounter (Signed)
Spoke with patient and informed him that his sed rate and CRP are unremarkable. Advised if he is not having any increase in headaches and no visual changes, he may reduce Prednisone to 12.5 mg daily. He will need repeat labs in one month. Patient stated he is doing fine, no problems. This RN repeated prednisone dosing. Patient verbalized understanding, appreciation of call. He stated he would return in 4 weeks for repeat labs.

## 2017-08-25 NOTE — Telephone Encounter (Signed)
Sedimentation rate and CRP was unremarkable.  As long as his headaches have not increased and he is not having any visual changes,he can decrease prednisone down to 12.5 mg daily.  We will recheck lab work in 1 month.

## 2017-09-21 ENCOUNTER — Other Ambulatory Visit: Payer: Self-pay | Admitting: *Deleted

## 2017-09-21 DIAGNOSIS — M316 Other giant cell arteritis: Secondary | ICD-10-CM

## 2017-09-22 ENCOUNTER — Other Ambulatory Visit (INDEPENDENT_AMBULATORY_CARE_PROVIDER_SITE_OTHER): Payer: Self-pay

## 2017-09-22 DIAGNOSIS — M316 Other giant cell arteritis: Secondary | ICD-10-CM

## 2017-09-22 DIAGNOSIS — Z0289 Encounter for other administrative examinations: Secondary | ICD-10-CM

## 2017-09-23 ENCOUNTER — Telehealth: Payer: Self-pay | Admitting: Neurology

## 2017-09-23 LAB — C-REACTIVE PROTEIN: CRP: 15.6 mg/L — ABNORMAL HIGH (ref 0.0–4.9)

## 2017-09-23 LAB — SEDIMENTATION RATE: Sed Rate: 7 mm/hr (ref 0–30)

## 2017-09-23 NOTE — Telephone Encounter (Signed)
I called the patient.  The patient is now on prednisone 12.5 mg daily.  The sedimentation rate remains normal, the C-reactive protein is elevated slightly, the patient will remain on his current dose of prednisone, he will call if he believes that some of his symptoms are returning on this lower dose.

## 2017-10-05 ENCOUNTER — Encounter: Payer: Self-pay | Admitting: Adult Health

## 2017-10-05 ENCOUNTER — Encounter (INDEPENDENT_AMBULATORY_CARE_PROVIDER_SITE_OTHER): Payer: Self-pay

## 2017-10-05 ENCOUNTER — Ambulatory Visit: Payer: Medicare Other | Admitting: Adult Health

## 2017-10-05 VITALS — BP 119/65 | HR 74 | Ht 67.0 in | Wt 170.2 lb

## 2017-10-05 DIAGNOSIS — M316 Other giant cell arteritis: Secondary | ICD-10-CM | POA: Diagnosis not present

## 2017-10-05 NOTE — Progress Notes (Addendum)
PATIENT: [name] DOB: [birth year]  REASON FOR VISIT: follow up HISTORY FROM: patient  HISTORY OF PRESENT ILLNESS:  Today 10/05/17  [name] is a 82 year old male with a history of temporal arteritis.  He returns today for follow-up.  He is currently on prednisone 12.5 mg daily.  He had blood work January 30 and his  Sedimentation rate remained in normal range but his CRP was slightly elevated.   For that reason he remained on 12.5 mg of prednisone.  He denies headaches or visual changes.  He does report that he has been having some jaw pain that has been present for over a year and has slowly been improving.  He denies any new neurological symptoms.  He returns today for evaluation.    HISTORY 06/21/17 [name] is a 82 year old male with a history of temporal arteritis. He returns today for follow-up. The patient recently had blood work and his prednisone was decreased to 17.5 mg daily. He reports that since we decreased the prednisone he's been having intermittent headaches typically in the temporal regions. He reports that the headache is very brief, lasting for only seconds. He denies any visual changes. He reports he does have macular degeneration. Denies fever. Denies jaw claudication. He remains on methotrexate. He returns today for an evaluation.   REVIEW OF SYSTEMS: Out of a complete 14 system review of symptoms, the patient complains only of the following symptoms, and all other reviewed systems are negative.  See HPI  ALLERGIES: Allergies  Allergen Reactions  . Metoprolol Swelling    swelling of legs    HOME MEDICATIONS: Outpatient Medications Prior to Visit  Medication Sig Dispense Refill  . aspirin 81 MG tablet Take 81 mg by mouth every other day. One tablet    . B Complex Vitamins (B COMPLEX 50) TABS Take 1 tablet by mouth daily.    . Calcium Carbonate-Vitamin D (CALTRATE 600+D PO) Take by mouth. Twice daily, chew    . cholecalciferol (VITAMIN D) 1000  UNITS tablet Take 1,000 Units by mouth 2 (two) times daily.    . folic acid (FOLVITE) 1 MG tablet Take 1 mg by mouth daily.  11  . methotrexate (RHEUMATREX) 2.5 MG tablet 2.5 mg.  3  . Multiple Vitamins-Minerals (PRESERVISION AREDS 2 PO) Take by mouth 2 (two) times daily.    . predniSONE (DELTASONE) 10 MG tablet Take 1 tablet (10 mg total) by mouth daily with breakfast. 30 tablet 3  . predniSONE (DELTASONE) 5 MG tablet Take 0.5 tablets (2.5 mg total) by mouth daily with breakfast. 30 tablet 3   No facility-administered medications prior to visit.     PAST MEDICAL HISTORY: Past Medical History:  Diagnosis Date  . Arthritis    temporal  . Degeneration macular   . Hearing aid worn    Right  . Hx of biopsy    temporal artery  . Hx of tonsillectomy   . Prostate cancer ([place])   . Spondylosis    cervical   . Temporal arteritis ([place]) 12/20/2012    PAST SURGICAL HISTORY: Past Surgical History:  Procedure Laterality Date  . radium seed implants for prostate cancer    . TONSILLECTOMY      FAMILY HISTORY: Family History  Problem Relation Age of Onset  . Stroke Sister   . Seizures Sister   . Heart disease Brother     SOCIAL HISTORY: Social History   Socioeconomic History  . Marital status: Married  Spouse name: Not on file  . Number of children: 2  . Years of education: hs  . Highest education level: Not on file  Social Needs  . Financial resource strain: Not on file  . Food insecurity - worry: Not on file  . Food insecurity - inability: Not on file  . Transportation needs - medical: Not on file  . Transportation needs - non-medical: Not on file  Occupational History  . Occupation: Retired  Tobacco Use  . Smoking status: Never Smoker  . Smokeless tobacco: Never Used  Substance and Sexual Activity  . Alcohol use: No  . Drug use: No  . Sexual activity: Not on file  Other Topics Concern  . Not on file  Social History Narrative   Lives at home w/ his wife    Patient is right handed.   Patient drinks "very little" caffeine.      PHYSICAL EXAM  Vitals:   10/05/17 1343  BP: 119/65  Pulse: 74  Weight: 170 lb 3.2 oz (77.2 kg)  Height: 5\' 7"  (1.702 m)   Body mass index is 26.66 kg/m.  Generalized: Well developed, in no acute distress   Neurological examination  Mentation: Alert oriented to time, place, history taking. Follows all commands speech and language fluent Cranial nerve II-XII: Pupils were equal round reactive to light. Extraocular movements were full, visual field were full on confrontational test. Facial sensation and strength were normal. Uvula tongue midline. Head turning and shoulder shrug  were normal and symmetric. Motor: The motor testing reveals 5 over 5 strength of all 4 extremities. Good symmetric motor tone is noted throughout.  Sensory: Sensory testing is intact to soft touch on all 4 extremities. No evidence of extinction is noted.  Coordination: Cerebellar testing reveals good finger-nose-finger and heel-to-shin bilaterally.  Gait and station: Gait is normal. Tandem gait is normal. Romberg is negative. No drift is seen.  Reflexes: Deep tendon reflexes are symmetric and normal bilaterally.   DIAGNOSTIC DATA (LABS, IMAGING, TESTING) - I reviewed patient records, labs, notes, testing and imaging myself where available.  Lab Results  Component Value Date   WBC 8.9 01/25/2017   HGB 12.4 (L) 01/25/2017   HCT 38.5 01/25/2017   MCV 90 01/25/2017   PLT 186 01/25/2017      Component Value Date/Time   NA 140 01/25/2017 1128   K 4.8 01/25/2017 1128   CL 97 01/25/2017 1128   CO2 27 01/25/2017 1128   GLUCOSE 127 (H) 01/25/2017 1128   GLUCOSE 125 (H) 04/23/2010 1950   BUN 21 01/25/2017 1128   CREATININE 1.10 01/25/2017 1128   CALCIUM 9.3 01/25/2017 1128   PROT 6.4 01/25/2017 1128   ALBUMIN 4.1 01/25/2017 1128   AST 18 01/25/2017 1128   ALT 17 01/25/2017 1128   ALKPHOS 45 01/25/2017 1128   BILITOT 0.4 01/25/2017  1128   GFRNONAA 64 01/25/2017 1128   GFRAA 73 01/25/2017 1128   No results found for: CHOL, HDL, LDLCALC, LDLDIRECT, TRIG, CHOLHDL No results found for: HGBA1C No results found for: VITAMINB12 No results found for: TSH    ASSESSMENT AND PLAN 82 y.o. year old male  has a past medical history of Arthritis, Degeneration macular, Hearing aid worn, biopsy, tonsillectomy, Prostate cancer ([place]), Spondylosis, and Temporal arteritis ([place]) (12/20/2012). here with:  1.  Temporal arteritis  Overall the patient symptoms have remained stable.  He will continue on prednisone 12.5 mg daily.  Advised that if jaw pain worsens he should let us  know.  We will recheck blood work at the beginning of March.  He continues to see Dr. Amil Amen who has him on methotrexate.  He is advised that if his symptoms worsen or he develops new symptoms he should let us know.  He will follow-up in 6 months or sooner if needed.  I spent 15 minutes with the patient. 50% of this time was spent discussing medication  Ward Givens, MSN, NP-C 10/05/2017, 2:41 PM [facility] [address], [place], [place] [address] [phone]

## 2017-10-05 NOTE — Progress Notes (Signed)
I have read the note, and I agree with the clinical assessment and plan.  Charles K Willis   

## 2017-10-05 NOTE — Patient Instructions (Signed)
Your Plan:  Continue prednisone 12.5 mg daily  Blood work at beginning of March If your symptoms worsen or you develop new symptoms please let us know.   Thank you for coming to see [place] at [facility]. I hope we have been able to provide you high quality care today.  You may receive a patient satisfaction survey over the next few weeks. We would appreciate your feedback and comments so that we may continue to improve ourselves and the health of our patients.  Temporal Arteritis Temporal arteritis, also called giant cell arteritis, is a condition that causes arteries to become swollen (inflamed). It usually affects arteries in your head and face, but arteries in any part of the body can become inflamed. Temporal arteritis can cause serious problems, such as bone loss, diabetes, and blindness. What are the causes? The cause is unknown. What increases the risk?  Being older than 66.  Being a woman.  Being Caucasian.  Being of [place], [place], [place], [place] or [place] ancestry.  Having polymyalgia rheumatica (PMR). What are the signs or symptoms? Some people with temporal arteritis have just one symptom, while other have several symptoms. Most signs and symptoms are related to the head and face. Signs and symptoms may include:  Hard or swollen temples (common). Your temples are the flattened area on either side of your forehead. If your temples are swollen, it may hurt to touch them.  Pain when combing your hair or when laying your head down.  Pain in the jaw when chewing.  Pain in the throat or tongue.  Problems with your vision, such as sudden loss of vision in one eye, or seeing double.  Fever.  Fatigue.  A dry cough.  Pain in the hips and shoulders.  Pain in the arms during exercise.  Depression.  Weight loss.  How is this diagnosed? Your health care provider will ask about your symptoms and do a physical exam. He or she may also perform  an eye exam and tests, such as:  A complete blood count.  An erythrocyte sedimentation rate test, also called the sed rate test.  A C-reactive protein (CRP) test.  A tissue sample (biopsy) test.  How is this treated? Temporal arteritis is treated with a type of medicine called a corticosteroid. Vision problems may be treated with additional medicines. You will need to see your health care provider while you are being treated. During follow-up visits, your health care provider will check for problems by:  Performing blood tests and bone density tests.  Checking your blood pressure and blood sugar.  Follow these instructions at home:  Take medicines only as directed by your health care provider.  Take any vitamins or supplements that your health care provider suggests. These may include vitamin D and calcium, which help keep your bones from becoming weak.  Exercise. Talk with your health care provider about what exercises are okay for you to do. Usually exercises that increase your heart rate (aerobic exercise), such as walking, are recommended. Aerobic exercise helps control your blood pressure and prevent bone loss.  Follow a healthy diet. Include healthy sources of protein, fruits, vegetables, and whole grains in your diet. Following a healthy diet helps prevent bone damage and diabetes. Contact a health care provider if:  Your symptoms get worse.  Your fever, fatigue, headache, weight loss, or pain in your jaw gets worse.  You develop signs of infection, such as fever, swelling, redness, warmth, and tenderness. Get help right  away if:  Your vision gets worse.  Your pain does not go away, even after you take pain medicine.  You have chest pain.  You have trouble breathing.  One side of your face or body suddenly becomes weak or numb. This information is not intended to replace advice given to you by your health care provider. Make sure you discuss any questions you have  with your health care provider. Document Released: 06/07/2009 Document Revised: 04/09/2016 Document Reviewed: 10/04/2013 Elsevier Interactive Patient Education  [employer].

## 2017-10-22 ENCOUNTER — Other Ambulatory Visit (INDEPENDENT_AMBULATORY_CARE_PROVIDER_SITE_OTHER): Payer: Medicare Other

## 2017-10-22 DIAGNOSIS — Z0289 Encounter for other administrative examinations: Secondary | ICD-10-CM

## 2017-10-22 DIAGNOSIS — M316 Other giant cell arteritis: Secondary | ICD-10-CM

## 2017-10-22 DIAGNOSIS — Z7952 Long term (current) use of systemic steroids: Secondary | ICD-10-CM | POA: Diagnosis not present

## 2017-10-23 LAB — COMPREHENSIVE METABOLIC PANEL
A/G RATIO: 1.9 (ref 1.2–2.2)
ALK PHOS: 56 IU/L (ref 39–117)
ALT: 20 IU/L (ref 0–44)
AST: 20 IU/L (ref 0–40)
Albumin: 4.1 g/dL (ref 3.5–4.8)
BUN/Creatinine Ratio: 15 (ref 10–24)
BUN: 19 mg/dL (ref 8–27)
Bilirubin Total: 0.6 mg/dL (ref 0.0–1.2)
CALCIUM: 9.3 mg/dL (ref 8.6–10.2)
CO2: 26 mmol/L (ref 20–29)
CREATININE: 1.25 mg/dL (ref 0.76–1.27)
Chloride: 103 mmol/L (ref 96–106)
GFR calc Af Amer: 63 mL/min/{1.73_m2} (ref >59)
GFR, EST NON AFRICAN AMERICAN: 54 mL/min/{1.73_m2} — AB (ref >59)
GLOBULIN, TOTAL: 2.2 g/dL (ref 1.5–4.5)
Glucose: 96 mg/dL (ref 65–99)
POTASSIUM: 4.3 mmol/L (ref 3.5–5.2)
SODIUM: 144 mmol/L (ref 134–144)
Total Protein: 6.3 g/dL (ref 6.0–8.5)

## 2017-10-23 LAB — CBC WITH DIFFERENTIAL/PLATELET
BASOS ABS: 0 10*3/uL (ref 0.0–0.2)
Basos: 0 %
EOS (ABSOLUTE): 0.1 10*3/uL (ref 0.0–0.4)
Eos: 1 %
Hematocrit: 38.5 % (ref 37.5–51.0)
Hemoglobin: 12.4 g/dL — ABNORMAL LOW (ref 13.0–17.7)
Immature Grans (Abs): 0 10*3/uL (ref 0.0–0.1)
Immature Granulocytes: 0 %
LYMPHS ABS: 0.9 10*3/uL (ref 0.7–3.1)
Lymphs: 9 %
MCH: 30.8 pg (ref 26.6–33.0)
MCHC: 32.2 g/dL (ref 31.5–35.7)
MCV: 96 fL (ref 79–97)
MONOS ABS: 0.5 10*3/uL (ref 0.1–0.9)
Monocytes: 5 %
NEUTROS ABS: 9.3 10*3/uL — AB (ref 1.4–7.0)
Neutrophils: 85 %
PLATELETS: 197 10*3/uL (ref 150–379)
RBC: 4.03 x10E6/uL — ABNORMAL LOW (ref 4.14–5.80)
RDW: 15.6 % — AB (ref 12.3–15.4)
WBC: 10.9 10*3/uL — ABNORMAL HIGH (ref 3.4–10.8)

## 2017-10-23 LAB — SEDIMENTATION RATE: SED RATE: 4 mm/h (ref 0–30)

## 2017-10-23 LAB — C-REACTIVE PROTEIN: CRP: 11.3 mg/L — ABNORMAL HIGH (ref 0.0–4.9)

## 2017-10-26 ENCOUNTER — Telehealth: Payer: Self-pay | Admitting: *Deleted

## 2017-10-26 NOTE — Telephone Encounter (Signed)
Called wife, [name] on [place] and informed her the patient's labs show his sedimentation rate is in normal range. The C reactive protein is still elevated but better than on hisprevious blood work. Advised her that for now he will remain on prednisone 12.5 mg daily per Jinny Blossom, NP. She asked when his next lab is due, stated the last one was 4 weeks apart. This RN advised the note doesn't state when next lab is due. [name] stated she will call in about 3 weeks to check. This RN advised they cal for any concerns,m questions. She verbalized understanding, appreciation.

## 2017-10-26 NOTE — Telephone Encounter (Signed)
Pt's wife returned Rn's call

## 2017-10-26 NOTE — Telephone Encounter (Signed)
LVM requesting call back re: lab results.  

## 2017-11-23 ENCOUNTER — Other Ambulatory Visit: Payer: Self-pay | Admitting: *Deleted

## 2017-11-23 ENCOUNTER — Other Ambulatory Visit (INDEPENDENT_AMBULATORY_CARE_PROVIDER_SITE_OTHER): Payer: Self-pay

## 2017-11-23 DIAGNOSIS — M316 Other giant cell arteritis: Secondary | ICD-10-CM

## 2017-11-23 DIAGNOSIS — Z0289 Encounter for other administrative examinations: Secondary | ICD-10-CM

## 2017-11-24 ENCOUNTER — Telehealth: Payer: Self-pay | Admitting: *Deleted

## 2017-11-24 LAB — C-REACTIVE PROTEIN: CRP: 9.3 mg/L — ABNORMAL HIGH (ref 0.0–4.9)

## 2017-11-24 LAB — SEDIMENTATION RATE: Sed Rate: 8 mm/hr (ref 0–30)

## 2017-11-24 NOTE — Telephone Encounter (Signed)
Spoke with wife, on [place] and informed her husband's Sedimentation rate is in normal range. the C-reactive protein is slightly elevated but continues to decline. Advised her that for now he will remain on prednisone 12.5 mg daily. She repeated the instructions correctly, verbalized understanding, appreciation of call.

## 2017-12-17 DIAGNOSIS — R739 Hyperglycemia, unspecified: Secondary | ICD-10-CM | POA: Diagnosis not present

## 2017-12-17 DIAGNOSIS — D649 Anemia, unspecified: Secondary | ICD-10-CM | POA: Diagnosis not present

## 2017-12-17 DIAGNOSIS — E611 Iron deficiency: Secondary | ICD-10-CM | POA: Diagnosis not present

## 2017-12-17 DIAGNOSIS — Z13820 Encounter for screening for osteoporosis: Secondary | ICD-10-CM | POA: Diagnosis not present

## 2017-12-17 DIAGNOSIS — Z0000 Encounter for general adult medical examination without abnormal findings: Secondary | ICD-10-CM | POA: Diagnosis not present

## 2017-12-17 DIAGNOSIS — M8580 Other specified disorders of bone density and structure, unspecified site: Secondary | ICD-10-CM | POA: Diagnosis not present

## 2017-12-17 DIAGNOSIS — M316 Other giant cell arteritis: Secondary | ICD-10-CM | POA: Diagnosis not present

## 2017-12-17 DIAGNOSIS — E785 Hyperlipidemia, unspecified: Secondary | ICD-10-CM | POA: Diagnosis not present

## 2017-12-17 DIAGNOSIS — Z79899 Other long term (current) drug therapy: Secondary | ICD-10-CM | POA: Diagnosis not present

## 2017-12-28 ENCOUNTER — Telehealth: Payer: Self-pay | Admitting: Adult Health

## 2017-12-28 DIAGNOSIS — M316 Other giant cell arteritis: Secondary | ICD-10-CM

## 2017-12-28 NOTE — Telephone Encounter (Signed)
Pt called to know if it was time for his labs to be drawn. Please call to advise

## 2017-12-29 ENCOUNTER — Other Ambulatory Visit (INDEPENDENT_AMBULATORY_CARE_PROVIDER_SITE_OTHER): Payer: Self-pay

## 2017-12-29 DIAGNOSIS — M316 Other giant cell arteritis: Secondary | ICD-10-CM | POA: Diagnosis not present

## 2017-12-29 DIAGNOSIS — Z0289 Encounter for other administrative examinations: Secondary | ICD-10-CM

## 2017-12-29 NOTE — Telephone Encounter (Signed)
Called the pt and informed him that the lab work order was placed. I informed him of the lab hours. Pt verbalized understanding.

## 2017-12-29 NOTE — Telephone Encounter (Signed)
Orders placed. Please have patient come in for blood work.

## 2017-12-30 ENCOUNTER — Telehealth: Payer: Self-pay | Admitting: Adult Health

## 2017-12-30 LAB — SEDIMENTATION RATE: SED RATE: 5 mm/h (ref 0–30)

## 2017-12-30 LAB — C-REACTIVE PROTEIN: CRP: 3 mg/L (ref 0.0–4.9)

## 2017-12-30 NOTE — Telephone Encounter (Signed)
Spoke with wife, [name] on [place] and informed her that patient's sed rate and CRP are WNL. Advised that if he is not having any symptoms such as headaches or vision disturbances she will decrease prednisone to 10 mg once a day. Advised her if his symptoms return after he has decreased Prednisone, he should call this office immediately. Wife verbalized understanding, appreciation of call.

## 2017-12-30 NOTE — Telephone Encounter (Signed)
Sedimentation rate and CRP are in normal range.  As long as he is not having any symptoms such as headache or visual disturbances we will decrease his prednisone to 10 mg daily.  Please advise patient that if his symptoms return once prednisone is decreased he should let us know immediately.  Please call patient.

## 2018-01-24 DIAGNOSIS — Z7952 Long term (current) use of systemic steroids: Secondary | ICD-10-CM | POA: Diagnosis not present

## 2018-01-24 DIAGNOSIS — M316 Other giant cell arteritis: Secondary | ICD-10-CM | POA: Diagnosis not present

## 2018-01-27 NOTE — Telephone Encounter (Signed)
Pts wife called stating that the sed rate was normal and CRP elevated. Requesting a call back to discuss prednisone dosing

## 2018-01-27 NOTE — Telephone Encounter (Addendum)
Pt had labs from Dr. Melissa Noon office and they are going to fax over the results.  He did not know the values of his sed rate and CRP that was done, but had received call to say that his CRP was elevated slightly.  He is taking prednisone 10mg  po daily.  Received CRP is 11.4 and sed rate 13.  Please advise re: prednisone dose.  He states he is feeling fine.

## 2018-01-27 NOTE — Telephone Encounter (Signed)
Keep current dose of Prednisone. If he begins to have headaches or visual changes let us know immediately.

## 2018-01-28 NOTE — Telephone Encounter (Signed)
Spoke to pts wife and relayed message per MM/NP re keeping current dose and if has headaches or visual changes to call us back immediately.  She stated she would.  Has appt in 4 wks with MM/NP.

## 2018-02-14 ENCOUNTER — Ambulatory Visit: Payer: Medicare Other | Admitting: Adult Health

## 2018-02-21 ENCOUNTER — Encounter: Payer: Self-pay | Admitting: Adult Health

## 2018-02-21 ENCOUNTER — Ambulatory Visit: Payer: Medicare Other | Admitting: Adult Health

## 2018-02-21 VITALS — BP 137/70 | HR 63 | Ht 67.0 in | Wt 168.0 lb

## 2018-02-21 DIAGNOSIS — M316 Other giant cell arteritis: Secondary | ICD-10-CM

## 2018-02-21 NOTE — Progress Notes (Signed)
I have read the note, and I agree with the clinical assessment and plan.  Shaniquia Brafford K Latise Dilley   

## 2018-02-21 NOTE — Patient Instructions (Signed)
Your Plan:  Continue prednisone 10 mg daily Blood work today If your symptoms worsen or you develop new symptoms please let us know.      Thank you for coming to see [place] at [facility]. I hope we have been able to provide you high quality care today.  You may receive a patient satisfaction survey over the next few weeks. We would appreciate your feedback and comments so that we may continue to improve ourselves and the health of our patients.

## 2018-02-21 NOTE — Progress Notes (Signed)
PATIENT: [name] DOB: 1935-09-15  REASON FOR VISIT: follow up HISTORY FROM: patient  HISTORY OF PRESENT ILLNESS: Today 02/21/18 [name] is an 82 year old male with a history of temporal arteritis.  He returns today for follow-up.  He is currently on prednisone 10 mg daily.  He had blood work in May with his rheumatologist and CRP was elevated.  We did not adjust his prednisone.  The patient denies any significant headaches.  He reports that he was having occasional headaches but the symptoms are not the same as when he was originally diagnosed with temporal arteritis.  He denies any visual disturbances.  He returns today for evaluation.   HISTORY 10/05/17  [name] is a 82 year old male with a history of temporal arteritis.  He returns today for follow-up.  He is currently on prednisone 12.5 mg daily.  He had blood work January 30 and his  Sedimentation rate remained in normal range but his CRP was slightly elevated.   For that reason he remained on 12.5 mg of prednisone.  He denies headaches or visual changes.  He does report that he has been having some jaw pain that has been present for over a year and has slowly been improving.  He denies any new neurological symptoms.  He returns today for evaluation.     REVIEW OF SYSTEMS: Out of a complete 14 system review of symptoms, the patient complains only of the following symptoms, and all other reviewed systems are negative.  See HPI  ALLERGIES: Allergies  Allergen Reactions  . Metoprolol Swelling    swelling of legs    HOME MEDICATIONS: Outpatient Medications Prior to Visit  Medication Sig Dispense Refill  . aspirin 81 MG tablet Take 81 mg by mouth every other day. One tablet    . B Complex Vitamins (B COMPLEX 50) TABS Take 1 tablet by mouth daily.    . Calcium Carbonate-Vitamin D (CALTRATE 600+D PO) Take by mouth. Twice daily, chew    . cholecalciferol (VITAMIN D) 1000 UNITS tablet Take 1,000 Units by mouth 2  (two) times daily.    . folic acid (FOLVITE) 1 MG tablet Take 1 mg by mouth daily.  11  . methotrexate (RHEUMATREX) 2.5 MG tablet 2.5 mg.  3  . Multiple Vitamins-Minerals (PRESERVISION AREDS 2 PO) Take by mouth 2 (two) times daily.    . predniSONE (DELTASONE) 10 MG tablet Take 1 tablet (10 mg total) by mouth daily with breakfast. 30 tablet 3   No facility-administered medications prior to visit.     PAST MEDICAL HISTORY: Past Medical History:  Diagnosis Date  . Arthritis    temporal  . Degeneration macular   . Hearing aid worn    Right  . Hx of biopsy    temporal artery  . Hx of tonsillectomy   . Prostate cancer ([place])   . Spondylosis    cervical   . Temporal arteritis ([place]) 12/20/2012    PAST SURGICAL HISTORY: Past Surgical History:  Procedure Laterality Date  . radium seed implants for prostate cancer    . TONSILLECTOMY      FAMILY HISTORY: Family History  Problem Relation Age of Onset  . Stroke Sister   . Seizures Sister   . Heart disease Brother     SOCIAL HISTORY: Social History   Socioeconomic History  . Marital status: Married    Spouse name: Not on file  . Number of children: 2  . Years of education: hs  .  Highest education level: Not on file  Occupational History  . Occupation: Retired  Scientific laboratory technician  . Financial resource strain: Not on file  . Food insecurity:    Worry: Not on file    Inability: Not on file  . Transportation needs:    Medical: Not on file    Non-medical: Not on file  Tobacco Use  . Smoking status: Never Smoker  . Smokeless tobacco: Never Used  Substance and Sexual Activity  . Alcohol use: No  . Drug use: No  . Sexual activity: Not on file  Lifestyle  . Physical activity:    Days per week: Not on file    Minutes per session: Not on file  . Stress: Not on file  Relationships  . Social connections:    Talks on phone: Not on file    Gets together: Not on file    Attends religious service: Not on file    Active member of  club or organization: Not on file    Attends meetings of clubs or organizations: Not on file    Relationship status: Not on file  . Intimate partner violence:    Fear of current or ex partner: Not on file    Emotionally abused: Not on file    Physically abused: Not on file    Forced sexual activity: Not on file  Other Topics Concern  . Not on file  Social History Narrative   Lives at home w/ his wife   Patient is right handed.   Patient drinks "very little" caffeine.      PHYSICAL EXAM  Vitals:   02/21/18 1520  BP: 137/70  Pulse: 63  Weight: 168 lb (76.2 kg)  Height: 5\' 7"  (1.702 m)   Body mass index is 26.31 kg/m.  Generalized: Well developed, in no acute distress   Neurological examination  Mentation: Alert oriented to time, place, history taking. Follows all commands speech and language fluent Cranial nerve II-XII: Pupils were equal round reactive to light. Extraocular movements were full, visual field were full on confrontational test. Facial sensation and strength were normal. Uvula tongue midline. Head turning and shoulder shrug  were normal and symmetric. Motor: The motor testing reveals 5 over 5 strength of all 4 extremities. Good symmetric motor tone is noted throughout.  Sensory: Sensory testing is intact to soft touch on all 4 extremities. No evidence of extinction is noted.  Coordination: Cerebellar testing reveals good finger-nose-finger and heel-to-shin bilaterally.  Gait and station: Gait is normal. Tandem gait is normal. Romberg is negative. No drift is seen.  Reflexes: Deep tendon reflexes are symmetric and normal bilaterally.   DIAGNOSTIC DATA (LABS, IMAGING, TESTING) - I reviewed patient records, labs, notes, testing and imaging myself where available.  Lab Results  Component Value Date   WBC 10.9 (H) 10/22/2017   HGB 12.4 (L) 10/22/2017   HCT 38.5 10/22/2017   MCV 96 10/22/2017   PLT 197 10/22/2017      Component Value Date/Time   NA 144  10/22/2017 0936   K 4.3 10/22/2017 0936   CL 103 10/22/2017 0936   CO2 26 10/22/2017 0936   GLUCOSE 96 10/22/2017 0936   GLUCOSE 125 (H) 04/23/2010 1950   BUN 19 10/22/2017 0936   CREATININE 1.25 10/22/2017 0936   CALCIUM 9.3 10/22/2017 0936   PROT 6.3 10/22/2017 0936   ALBUMIN 4.1 10/22/2017 0936   AST 20 10/22/2017 0936   ALT 20 10/22/2017 0936   ALKPHOS 56 10/22/2017 0936  BILITOT 0.6 10/22/2017 0936   GFRNONAA 54 (L) 10/22/2017 0936   GFRAA 63 10/22/2017 0936      ASSESSMENT AND PLAN 82 y.o. year old male  has a past medical history of Arthritis, Degeneration macular, Hearing aid worn, biopsy, tonsillectomy, Prostate cancer ([place]), Spondylosis, and Temporal arteritis ([place]) (12/20/2012). here with:  1.  Temporal arteritis  I will check sedimentation rate as well as CRP today.  If his levels are normal range we will consider further reducing his prednisone dose.  For now he will remain on prednisone 10 mg daily.  I advised if his symptoms return or he has any changes in his vision he should let us know immediately.  He will follow-up in 6 months or sooner if needed.  I spent 15 minutes with the patient. 50% of this time was spent discussing plan of care  Ward Givens, MSN, NP-C 02/21/2018, 11:59 AM [facility] [address], [place], [place] [address] [phone]

## 2018-02-22 ENCOUNTER — Telehealth: Payer: Self-pay | Admitting: Adult Health

## 2018-02-22 LAB — C-REACTIVE PROTEIN: CRP: 9 mg/L (ref 0–10)

## 2018-02-22 LAB — SEDIMENTATION RATE: SED RATE: 40 mm/h — AB (ref 0–30)

## 2018-02-22 NOTE — Telephone Encounter (Signed)
Patient in on Prednisone 10 mg daily.  His sedimentation rate has increased to 40.  In the office visit he was not complaining any headaches.  He states on occasion he will have a headache but these are not the same headaches he had when he was first diagnosed with temporal arteritis.  He denies any visual changes.  I advised the patient's wife that I would discuss with Dr. Jannifer Franklin and be in touch.

## 2018-02-22 NOTE — Telephone Encounter (Signed)
Sedimentation rate elevation is mild, C-reactive protein is normal.  Would hold off on dose increase for now, recheck blood work in about 3 months.

## 2018-02-23 NOTE — Telephone Encounter (Signed)
I called the patient.  The sedimentation recently went from 8-40, upper limits of normal is 30.  The patient otherwise is feeling well, the methotrexate should be continued, we really need to follow the sedimentation rate.  If the sedimentation rate remains elevated, the prednisone should be increased.  Clinically, he seems to be doing well.

## 2018-02-23 NOTE — Telephone Encounter (Signed)
Called and spoke with the patients wife and informed her that Dr Jannifer Franklin and Ward Givens, NP discussed and felt that after looking at the lab work it would be best to hold off on increasing the prednisone at this time. He would like to keep the medications at same dose of 10 mg and have him come in for lab recheck within 3 mths vs 1 mth. I did review that if the patient began to have headache or visual changes to contact us and make [place] aware. Patient's wife verbalized understanding. She wanted me to as the question to Dr Jannifer Franklin or Jinny Blossom. Dr Jannifer Franklin had sent them to another MD who had started him on methotrexate 2.5 mg 5 pills every Friday. She states since this medication has not helped at all would this be safe to stop? I informed her that I would pass along this question and call her back with what they recommend. Patients wife was appreciative for the call.

## 2018-02-23 NOTE — Telephone Encounter (Signed)
Please call patient and let me know that Dr. Jannifer Franklin suggested that he stay on 10 mg prednisone.  If he begins having headaches or visual changes he will let us know.  Please also let them know that Dr. Jannifer Franklin recommended checking blood work in 3 months instead of 1 month.

## 2018-04-04 DIAGNOSIS — M19012 Primary osteoarthritis, left shoulder: Secondary | ICD-10-CM | POA: Diagnosis not present

## 2018-04-04 DIAGNOSIS — M19011 Primary osteoarthritis, right shoulder: Secondary | ICD-10-CM | POA: Diagnosis not present

## 2018-04-04 DIAGNOSIS — M7552 Bursitis of left shoulder: Secondary | ICD-10-CM | POA: Diagnosis not present

## 2018-04-04 DIAGNOSIS — M12812 Other specific arthropathies, not elsewhere classified, left shoulder: Secondary | ICD-10-CM | POA: Diagnosis not present

## 2018-04-26 DIAGNOSIS — Z7952 Long term (current) use of systemic steroids: Secondary | ICD-10-CM | POA: Diagnosis not present

## 2018-04-26 DIAGNOSIS — M316 Other giant cell arteritis: Secondary | ICD-10-CM | POA: Diagnosis not present

## 2018-04-27 ENCOUNTER — Telehealth: Payer: Self-pay | Admitting: Neurology

## 2018-04-27 NOTE — Telephone Encounter (Signed)
I received blood work results from Dr. Amil Amen that were done on 26 April 2018.  White blood count is 10.0, hemoglobin 12.6, hematocrit 37.4, MCV of 92, platelets of 167.  Sedimentation rate is 18, C-reactive protein of 2.  Glucose is 83, BUN of 24, creatinine of 1.35, sodium of 141, potassium 4.4, chloride 99, CO2 27, calcium 9.1, total protein 6.3, albumin of 4.1, liver profile is unremarkable.

## 2018-04-28 MED ORDER — PREDNISONE 1 MG PO TABS: ORAL_TABLET | ORAL | 1 refills | Status: DC

## 2018-04-28 MED ORDER — PREDNISONE 5 MG PO TABS: 5.0000 mg | ORAL_TABLET | Freq: Every day | ORAL | 3 refills | Status: AC

## 2018-04-28 NOTE — Telephone Encounter (Signed)
Pts wife [name] called stating they are unsure if Dr. Jannifer Franklin is wanting the pt to reduce dosing for predniSONE (DELTASONE) 10 MG tablet based off of labs results. Please call to advise

## 2018-04-28 NOTE — Addendum Note (Signed)
Addended by: Kathrynn Ducking on: 04/28/2018 02:08 PM   Modules accepted: Orders

## 2018-04-28 NOTE — Telephone Encounter (Signed)
I called the patient, talk with the wife.  The patient is having occasional headaches that are in the right side of the head, the patient has right-sided neck pain.  His sedimentation rate and C-reactive protein were completely normal.  I may try dropping the prednisone slightly going to 9 mg daily for 6 weeks and then go to 8 mg daily.

## 2018-05-04 ENCOUNTER — Other Ambulatory Visit: Payer: Self-pay | Admitting: Adult Health

## 2018-06-13 DIAGNOSIS — N3001 Acute cystitis with hematuria: Secondary | ICD-10-CM | POA: Diagnosis not present

## 2018-06-13 DIAGNOSIS — R319 Hematuria, unspecified: Secondary | ICD-10-CM | POA: Diagnosis not present

## 2018-06-15 DIAGNOSIS — R310 Gross hematuria: Secondary | ICD-10-CM | POA: Diagnosis not present

## 2018-06-15 DIAGNOSIS — Z2821 Immunization not carried out because of patient refusal: Secondary | ICD-10-CM | POA: Diagnosis not present

## 2018-07-05 DIAGNOSIS — R310 Gross hematuria: Secondary | ICD-10-CM | POA: Diagnosis not present

## 2018-07-06 ENCOUNTER — Telehealth: Payer: Self-pay | Admitting: Adult Health

## 2018-07-06 DIAGNOSIS — M316 Other giant cell arteritis: Secondary | ICD-10-CM

## 2018-07-06 NOTE — Telephone Encounter (Signed)
Pts [name](on DPR) requesting a call needing to know if the pt is due for his routine blood work

## 2018-07-07 NOTE — Telephone Encounter (Signed)
Sedimentation rate and CRP ordered.  Patient can come in at his convenience  to have his blood drawn.

## 2018-07-18 DIAGNOSIS — R310 Gross hematuria: Secondary | ICD-10-CM | POA: Diagnosis not present

## 2018-07-18 DIAGNOSIS — N3289 Other specified disorders of bladder: Secondary | ICD-10-CM | POA: Diagnosis not present

## 2018-07-18 DIAGNOSIS — I700 Atherosclerosis of aorta: Secondary | ICD-10-CM | POA: Diagnosis not present

## 2018-07-25 DIAGNOSIS — R310 Gross hematuria: Secondary | ICD-10-CM | POA: Diagnosis not present

## 2018-07-26 DIAGNOSIS — Z7952 Long term (current) use of systemic steroids: Secondary | ICD-10-CM | POA: Diagnosis not present

## 2018-07-26 DIAGNOSIS — M316 Other giant cell arteritis: Secondary | ICD-10-CM | POA: Diagnosis not present

## 2018-07-27 ENCOUNTER — Telehealth: Payer: Self-pay | Admitting: Neurology

## 2018-07-27 NOTE — Telephone Encounter (Signed)
I have received recent blood work done on 26 July 2018.  White blood count 9.3, hemoglobin 12.6, hematocrit 36.1, MCV of 92, platelets of 232. Sedimentation rate of 31, normal for lab is up to 30.  C-reactive protein was 70, upper range of normal is 10 for this lab.  Glucose of 109, BUN of 21, creatinine of 1.39, estimated GFR of 48, sodium 142, potassium 4.3, chloride 100, CO2 24, calcium 9.7, total protein 6.5, albumin of 4.2, liver profile is unremarkable.

## 2018-08-23 DIAGNOSIS — H353131 Nonexudative age-related macular degeneration, bilateral, early dry stage: Secondary | ICD-10-CM | POA: Diagnosis not present

## 2018-08-23 DIAGNOSIS — H26491 Other secondary cataract, right eye: Secondary | ICD-10-CM | POA: Diagnosis not present

## 2018-09-21 ENCOUNTER — Ambulatory Visit: Payer: Medicare Other | Admitting: Adult Health

## 2018-09-21 ENCOUNTER — Encounter: Payer: Self-pay | Admitting: Adult Health

## 2018-09-21 VITALS — BP 122/70 | HR 74 | Ht 67.0 in | Wt 172.2 lb

## 2018-09-21 DIAGNOSIS — M316 Other giant cell arteritis: Secondary | ICD-10-CM

## 2018-09-21 NOTE — Progress Notes (Addendum)
PATIENT: [name] DOB: 1935-09-09  REASON FOR VISIT: follow up HISTORY FROM: patient  HISTORY OF PRESENT ILLNESS: Today 09/21/18  [name] is an 83 year old male presenting for follow-up for temporal arteritis. He is taking 9 mg prednisone daily. He does report occasional headaches to his right temple. He thinks it could be sinus related, because he has nasal drainage. He takes tylenol and that helps his headache. Denies any visual changes or problems. He went to the eye doctor in December 2019 and got a good report without changes. He saw his Rheumatologist in December 2019 and had blood work done. CRP was 70, ESR was 31 on 07/26/2018. He returns today for follow-up with his wife.    HISTORY  Today 02/21/18 [name] is an 83 year old male with a history of temporal arteritis.  He returns today for follow-up.  He is currently on prednisone 10 mg daily.  He had blood work in May with his rheumatologist and CRP was elevated.  We did not adjust his prednisone.  The patient denies any significant headaches.  He reports that he was having occasional headaches but the symptoms are not the same as when he was originally diagnosed with temporal arteritis.  He denies any visual disturbances.  He returns today for evaluation.   REVIEW OF SYSTEMS: Out of a complete 14 system review of symptoms, the patient complains only of the following symptoms, and all other reviewed systems are negative.  Hearing loss, runny nose   ALLERGIES: Allergies  Allergen Reactions  . Metoprolol Swelling    swelling of legs    HOME MEDICATIONS: Outpatient Medications Prior to Visit  Medication Sig Dispense Refill  . aspirin 81 MG tablet Take 81 mg by mouth daily. One tablet     . B Complex Vitamins (B COMPLEX 50) TABS Take 1 tablet by mouth daily.    . Calcium Carbonate-Vitamin D (CALTRATE 600+D PO) Take by mouth. Twice daily, chew    . cholecalciferol (VITAMIN D) 1000 UNITS tablet Take 1,000 Units  by mouth 2 (two) times daily.    . folic acid (FOLVITE) 1 MG tablet Take 1 mg by mouth daily.  11  . methotrexate (RHEUMATREX) 2.5 MG tablet 2.5 mg.  3  . Multiple Vitamins-Minerals (PRESERVISION AREDS 2 PO) Take by mouth 2 (two) times daily.    . predniSONE (DELTASONE) 1 MG tablet 4 tablets daily for 6 weeks then take 3 tablets daily 360 tablet 1  . predniSONE (DELTASONE) 5 MG tablet Take 1 tablet (5 mg total) by mouth daily with breakfast. 90 tablet 3   No facility-administered medications prior to visit.     PAST MEDICAL HISTORY: Past Medical History:  Diagnosis Date  . Arthritis    temporal  . Degeneration macular   . Hearing aid worn    Right  . Hx of biopsy    temporal artery  . Hx of tonsillectomy   . Prostate cancer ([place])   . Spondylosis    cervical   . Temporal arteritis ([place]) 12/20/2012    PAST SURGICAL HISTORY: Past Surgical History:  Procedure Laterality Date  . radium seed implants for prostate cancer    . TONSILLECTOMY      FAMILY HISTORY: Family History  Problem Relation Age of Onset  . Stroke Sister   . Seizures Sister   . Heart disease Brother     SOCIAL HISTORY: Social History   Socioeconomic History  . Marital status: Married    Spouse name:  Not on file  . Number of children: 2  . Years of education: hs  . Highest education level: Not on file  Occupational History  . Occupation: Retired  Scientific laboratory technician  . Financial resource strain: Not on file  . Food insecurity:    Worry: Not on file    Inability: Not on file  . Transportation needs:    Medical: Not on file    Non-medical: Not on file  Tobacco Use  . Smoking status: Never Smoker  . Smokeless tobacco: Never Used  Substance and Sexual Activity  . Alcohol use: No  . Drug use: No  . Sexual activity: Not on file  Lifestyle  . Physical activity:    Days per week: Not on file    Minutes per session: Not on file  . Stress: Not on file  Relationships  . Social connections:    Talks on  phone: Not on file    Gets together: Not on file    Attends religious service: Not on file    Active member of club or organization: Not on file    Attends meetings of clubs or organizations: Not on file    Relationship status: Not on file  . Intimate partner violence:    Fear of current or ex partner: Not on file    Emotionally abused: Not on file    Physically abused: Not on file    Forced sexual activity: Not on file  Other Topics Concern  . Not on file  Social History Narrative   Lives at home w/ his wife   Patient is right handed.   Patient drinks "very little" caffeine.      PHYSICAL EXAM  Vitals:   09/21/18 1323  BP: 122/70  Pulse: 74  Weight: 172 lb 3.2 oz (78.1 kg)  Height: 5' 7"  (1.702 m)   Body mass index is 26.97 kg/m.  Generalized: Well developed, in no acute distress   Neurological examination  Mentation: Alert oriented to time, place, history taking. Follows all commands speech and language fluent Cranial nerve II-XII: Pupils were equal round reactive to light. Extraocular movements were full, visual field were full on confrontational test. Facial sensation and strength were normal. Uvula tongue midline. Head turning and shoulder shrug  were normal and symmetric. Motor: The motor testing reveals 5 over 5 strength of all 4 extremities. Good symmetric motor tone is noted throughout.  Sensory: Sensory testing is intact to soft touch on all 4 extremities. No evidence of extinction is noted.  Coordination: Cerebellar testing reveals good finger-nose-finger and heel-to-shin bilaterally.  Gait and station: Gait is normal. Tandem gait is normal. Romberg is negative. No drift is seen.  Reflexes: Deep tendon reflexes are symmetric and normal bilaterally.   DIAGNOSTIC DATA (LABS, IMAGING, TESTING) - I reviewed patient records, labs, notes, testing and imaging myself where available.  Lab Results  Component Value Date   WBC 10.9 (H) 10/22/2017   HGB 12.4 (L)  10/22/2017   HCT 38.5 10/22/2017   MCV 96 10/22/2017   PLT 197 10/22/2017      Component Value Date/Time   NA 144 10/22/2017 0936   K 4.3 10/22/2017 0936   CL 103 10/22/2017 0936   CO2 26 10/22/2017 0936   GLUCOSE 96 10/22/2017 0936   GLUCOSE 125 (H) 04/23/2010 1950   BUN 19 10/22/2017 0936   CREATININE 1.25 10/22/2017 0936   CALCIUM 9.3 10/22/2017 0936   PROT 6.3 10/22/2017 0936   ALBUMIN 4.1 10/22/2017  0936   AST 20 10/22/2017 0936   ALT 20 10/22/2017 0936   ALKPHOS 56 10/22/2017 0936   BILITOT 0.6 10/22/2017 0936   GFRNONAA 54 (L) 10/22/2017 0936   GFRAA 63 10/22/2017 0936      ASSESSMENT AND PLAN 83 y.o. year old male  has a past medical history of Arthritis, Degeneration macular, Hearing aid worn, biopsy, tonsillectomy, Prostate cancer ([place]), Spondylosis, and Temporal arteritis ([place]) (12/20/2012). here with:  1. Temporal Arteritis  He is doing well at this time. He is taking 9 mg of prednisone daily. He does report some intermittent and occasional right sided headaches. We will check ESR and CRP today. We will closely follow these results to determine if the prednisone dose needs to be adjusted. He will follow-up in 6 months or sooner if needed.   I spent 15 minutes with the patient. 50% of this time was spent discussing his plan care.    Butler Denmark, AGNP-C, DNP 09/21/2018, 1:33 PM [facility] [address] [place], [place] [address] [phone]    Kathrynn Ducking

## 2018-09-21 NOTE — Progress Notes (Signed)
I have read the note, and I agree with the clinical assessment and plan.  Desha Bitner K Daisie Haft   

## 2018-09-22 LAB — SEDIMENTATION RATE: Sed Rate: 22 mm/hr (ref 0–30)

## 2018-09-22 LAB — C-REACTIVE PROTEIN: CRP: 13 mg/L — ABNORMAL HIGH (ref 0–10)

## 2018-09-28 ENCOUNTER — Telehealth: Payer: Self-pay | Admitting: Adult Health

## 2018-09-28 NOTE — Telephone Encounter (Signed)
LMVM for wife that sed rate normal and CRP slightly elavated, to continue on currrent dose of prednisone.  They are to call back if questions.

## 2018-09-28 NOTE — Telephone Encounter (Signed)
Pt's wife called wanting to know the lab results. Please advise.

## 2018-10-19 ENCOUNTER — Other Ambulatory Visit: Payer: Self-pay | Admitting: Neurology

## 2018-10-25 ENCOUNTER — Encounter: Payer: Self-pay | Admitting: Adult Health

## 2018-10-25 DIAGNOSIS — M316 Other giant cell arteritis: Secondary | ICD-10-CM | POA: Diagnosis not present

## 2018-10-25 DIAGNOSIS — Z7952 Long term (current) use of systemic steroids: Secondary | ICD-10-CM | POA: Diagnosis not present

## 2018-10-26 ENCOUNTER — Telehealth: Payer: Self-pay | Admitting: Neurology

## 2018-10-26 NOTE — Telephone Encounter (Signed)
The patient has recently had blood work done by Dr. Amil Amen on 25 October 2018.  White blood count was 9.7, hemoglobin of 12.4, hematocrit 37.7, platelets of 201, MCV of 93.  Sedimentation rate is 38, the upper range of normal for this lab is 30.  C-reactive protein is well within normal limits, 7.  Comprehensive metabolic profile reveals a glucose of 106, BUN 25, creatinine of 1.56, estimated GFR is 41.  Sodium 144, potassium 4.7, chloride 98, CO2 24, calcium 9.7, total protein 6.0, albumin 4.2, alkaline phosphatase, AST and ALT are normal.  Good summer

## 2018-11-07 DIAGNOSIS — L570 Actinic keratosis: Secondary | ICD-10-CM | POA: Diagnosis not present

## 2018-11-07 DIAGNOSIS — C44329 Squamous cell carcinoma of skin of other parts of face: Secondary | ICD-10-CM | POA: Diagnosis not present

## 2018-11-07 DIAGNOSIS — L578 Other skin changes due to chronic exposure to nonionizing radiation: Secondary | ICD-10-CM | POA: Diagnosis not present

## 2018-11-07 DIAGNOSIS — L821 Other seborrheic keratosis: Secondary | ICD-10-CM | POA: Diagnosis not present

## 2018-12-29 DIAGNOSIS — M316 Other giant cell arteritis: Secondary | ICD-10-CM | POA: Diagnosis not present

## 2018-12-29 DIAGNOSIS — D6489 Other specified anemias: Secondary | ICD-10-CM | POA: Diagnosis not present

## 2018-12-29 DIAGNOSIS — E785 Hyperlipidemia, unspecified: Secondary | ICD-10-CM | POA: Diagnosis not present

## 2018-12-29 DIAGNOSIS — R739 Hyperglycemia, unspecified: Secondary | ICD-10-CM | POA: Diagnosis not present

## 2018-12-29 DIAGNOSIS — Z780 Asymptomatic menopausal state: Secondary | ICD-10-CM | POA: Diagnosis not present

## 2018-12-29 DIAGNOSIS — M8589 Other specified disorders of bone density and structure, multiple sites: Secondary | ICD-10-CM | POA: Diagnosis not present

## 2018-12-29 DIAGNOSIS — Z0000 Encounter for general adult medical examination without abnormal findings: Secondary | ICD-10-CM | POA: Diagnosis not present

## 2018-12-29 DIAGNOSIS — R51 Headache: Secondary | ICD-10-CM | POA: Diagnosis not present

## 2019-01-25 ENCOUNTER — Encounter: Payer: Self-pay | Admitting: Adult Health

## 2019-01-25 DIAGNOSIS — Z7952 Long term (current) use of systemic steroids: Secondary | ICD-10-CM | POA: Diagnosis not present

## 2019-01-25 DIAGNOSIS — M316 Other giant cell arteritis: Secondary | ICD-10-CM | POA: Diagnosis not present

## 2019-01-31 ENCOUNTER — Telehealth: Payer: Self-pay | Admitting: Neurology

## 2019-01-31 NOTE — Telephone Encounter (Signed)
The patient is had blood work done through Dr. Amil Amen.  The blood work was done on 25 January 2019.  White blood count is 8.7, hemoglobin of 12.6, hematocrit of 36.6.  MCV of 92, platelets of 190.  Sedimentation rate is 28.  The glucose is 97, BUN of 26, creatinine of 1.  2 6.  Estimated GFR of 53.  Sodium was 143, potassium 4.4, chloride of 102, CO2 25, calcium 9.3, total protein 6.3, albumin 4.3, liver profile is unremarkable.

## 2019-04-12 ENCOUNTER — Ambulatory Visit: Payer: Medicare Other | Admitting: Adult Health

## 2019-05-02 DIAGNOSIS — M316 Other giant cell arteritis: Secondary | ICD-10-CM | POA: Diagnosis not present

## 2019-05-02 DIAGNOSIS — Z7952 Long term (current) use of systemic steroids: Secondary | ICD-10-CM | POA: Diagnosis not present

## 2019-05-04 ENCOUNTER — Emergency Department (HOSPITAL_COMMUNITY): Payer: Medicare Other

## 2019-05-04 ENCOUNTER — Inpatient Hospital Stay (HOSPITAL_COMMUNITY)
Admission: EM | Admit: 2019-05-04 | Discharge: 2019-05-06 | DRG: 556 | Disposition: A | Discharge status: Home / Self Care | Payer: Medicare Other | Attending: Internal Medicine | Admitting: Internal Medicine

## 2019-05-04 ENCOUNTER — Other Ambulatory Visit: Payer: Self-pay

## 2019-05-04 DIAGNOSIS — Y92239 Unspecified place in hospital as the place of occurrence of the external cause: Secondary | ICD-10-CM | POA: Diagnosis present

## 2019-05-04 DIAGNOSIS — C61 Malignant neoplasm of prostate: Secondary | ICD-10-CM | POA: Diagnosis present

## 2019-05-04 DIAGNOSIS — Z823 Family history of stroke: Secondary | ICD-10-CM

## 2019-05-04 DIAGNOSIS — Z7982 Long term (current) use of aspirin: Secondary | ICD-10-CM

## 2019-05-04 DIAGNOSIS — M009 Pyogenic arthritis, unspecified: Secondary | ICD-10-CM | POA: Diagnosis not present

## 2019-05-04 DIAGNOSIS — M25452 Effusion, left hip: Secondary | ICD-10-CM | POA: Diagnosis not present

## 2019-05-04 DIAGNOSIS — S76012A Strain of muscle, fascia and tendon of left hip, initial encounter: Secondary | ICD-10-CM | POA: Diagnosis not present

## 2019-05-04 DIAGNOSIS — Z8546 Personal history of malignant neoplasm of prostate: Secondary | ICD-10-CM

## 2019-05-04 DIAGNOSIS — Z03818 Encounter for observation for suspected exposure to other biological agents ruled out: Secondary | ICD-10-CM | POA: Diagnosis not present

## 2019-05-04 DIAGNOSIS — D649 Anemia, unspecified: Secondary | ICD-10-CM

## 2019-05-04 DIAGNOSIS — I480 Paroxysmal atrial fibrillation: Secondary | ICD-10-CM

## 2019-05-04 DIAGNOSIS — R110 Nausea: Secondary | ICD-10-CM

## 2019-05-04 DIAGNOSIS — I952 Hypotension due to drugs: Secondary | ICD-10-CM | POA: Diagnosis not present

## 2019-05-04 DIAGNOSIS — M316 Other giant cell arteritis: Secondary | ICD-10-CM

## 2019-05-04 DIAGNOSIS — Z20828 Contact with and (suspected) exposure to other viral communicable diseases: Secondary | ICD-10-CM | POA: Diagnosis present

## 2019-05-04 DIAGNOSIS — Z7952 Long term (current) use of systemic steroids: Secondary | ICD-10-CM

## 2019-05-04 DIAGNOSIS — M25559 Pain in unspecified hip: Secondary | ICD-10-CM | POA: Diagnosis present

## 2019-05-04 DIAGNOSIS — M6088 Other myositis, other site: Secondary | ICD-10-CM | POA: Diagnosis not present

## 2019-05-04 DIAGNOSIS — Z8249 Family history of ischemic heart disease and other diseases of the circulatory system: Secondary | ICD-10-CM | POA: Diagnosis not present

## 2019-05-04 DIAGNOSIS — M25552 Pain in left hip: Secondary | ICD-10-CM | POA: Diagnosis not present

## 2019-05-04 DIAGNOSIS — X58XXXA Exposure to other specified factors, initial encounter: Secondary | ICD-10-CM | POA: Diagnosis present

## 2019-05-04 DIAGNOSIS — N289 Disorder of kidney and ureter, unspecified: Secondary | ICD-10-CM | POA: Diagnosis present

## 2019-05-04 DIAGNOSIS — D630 Anemia in neoplastic disease: Secondary | ICD-10-CM | POA: Diagnosis present

## 2019-05-04 DIAGNOSIS — I4891 Unspecified atrial fibrillation: Secondary | ICD-10-CM | POA: Diagnosis present

## 2019-05-04 DIAGNOSIS — R42 Dizziness and giddiness: Secondary | ICD-10-CM | POA: Diagnosis not present

## 2019-05-04 DIAGNOSIS — D849 Immunodeficiency, unspecified: Secondary | ICD-10-CM | POA: Diagnosis present

## 2019-05-04 DIAGNOSIS — H9190 Unspecified hearing loss, unspecified ear: Secondary | ICD-10-CM | POA: Diagnosis not present

## 2019-05-04 DIAGNOSIS — D899 Disorder involving the immune mechanism, unspecified: Secondary | ICD-10-CM

## 2019-05-04 DIAGNOSIS — T402X5A Adverse effect of other opioids, initial encounter: Secondary | ICD-10-CM | POA: Diagnosis present

## 2019-05-04 DIAGNOSIS — Z79899 Other long term (current) drug therapy: Secondary | ICD-10-CM | POA: Diagnosis not present

## 2019-05-04 LAB — CBC WITH DIFFERENTIAL/PLATELET
Abs Immature Granulocytes: 0.03 10*3/uL (ref 0.00–0.07)
Basophils Absolute: 0 10*3/uL (ref 0.0–0.1)
Basophils Relative: 0 %
Eosinophils Absolute: 0.1 10*3/uL (ref 0.0–0.5)
Eosinophils Relative: 2 %
HCT: 37 % — ABNORMAL LOW (ref 39.0–52.0)
Hemoglobin: 12 g/dL — ABNORMAL LOW (ref 13.0–17.0)
Immature Granulocytes: 0 %
Lymphocytes Relative: 12 %
Lymphs Abs: 1 10*3/uL (ref 0.7–4.0)
MCH: 31.3 pg (ref 26.0–34.0)
MCHC: 32.4 g/dL (ref 30.0–36.0)
MCV: 96.4 fL (ref 80.0–100.0)
Monocytes Absolute: 0.7 10*3/uL (ref 0.1–1.0)
Monocytes Relative: 9 %
Neutro Abs: 6.8 10*3/uL (ref 1.7–7.7)
Neutrophils Relative %: 77 %
Platelets: 171 10*3/uL (ref 150–400)
RBC: 3.84 MIL/uL — ABNORMAL LOW (ref 4.22–5.81)
RDW: 14.7 % (ref 11.5–15.5)
WBC: 8.8 10*3/uL (ref 4.0–10.5)
nRBC: 0 % (ref 0.0–0.2)

## 2019-05-04 LAB — COMPREHENSIVE METABOLIC PANEL
ALT: 15 U/L (ref 0–44)
AST: 19 U/L (ref 15–41)
Albumin: 3.5 g/dL (ref 3.5–5.0)
Alkaline Phosphatase: 49 U/L (ref 38–126)
Anion gap: 11 (ref 5–15)
BUN: 27 mg/dL — ABNORMAL HIGH (ref 8–23)
CO2: 25 mmol/L (ref 22–32)
Calcium: 8.7 mg/dL — ABNORMAL LOW (ref 8.9–10.3)
Chloride: 104 mmol/L (ref 98–111)
Creatinine, Ser: 1.42 mg/dL — ABNORMAL HIGH (ref 0.61–1.24)
GFR calc Af Amer: 53 mL/min — ABNORMAL LOW (ref >60)
GFR calc non Af Amer: 46 mL/min — ABNORMAL LOW (ref >60)
Glucose, Bld: 104 mg/dL — ABNORMAL HIGH (ref 70–99)
Potassium: 3.7 mmol/L (ref 3.5–5.1)
Sodium: 140 mmol/L (ref 135–145)
Total Bilirubin: 0.7 mg/dL (ref 0.3–1.2)
Total Protein: 6.1 g/dL — ABNORMAL LOW (ref 6.5–8.1)

## 2019-05-04 LAB — LIPASE, BLOOD: Lipase: 26 U/L (ref 11–51)

## 2019-05-04 LAB — SEDIMENTATION RATE: Sed Rate: 29 mm/hr — ABNORMAL HIGH (ref 0–16)

## 2019-05-04 LAB — MRSA PCR SCREENING: MRSA by PCR: NEGATIVE

## 2019-05-04 LAB — C-REACTIVE PROTEIN: CRP: 7 mg/dL — ABNORMAL HIGH (ref <1.0)

## 2019-05-04 LAB — SARS CORONAVIRUS 2 BY RT PCR (HOSPITAL ORDER, PERFORMED IN ~~LOC~~ HOSPITAL LAB): SARS Coronavirus 2: NEGATIVE

## 2019-05-04 MED ORDER — SODIUM CHLORIDE 0.9 % IV SOLN
INTRAVENOUS | Status: DC
  Administered 2019-05-04 – 2019-05-05 (×2): via INTRAVENOUS

## 2019-05-04 MED ORDER — GADOBUTROL 1 MMOL/ML IV SOLN
7.0000 mL | Freq: Once | INTRAVENOUS | Status: AC | PRN
  Administered 2019-05-04: 7 mL via INTRAVENOUS

## 2019-05-04 MED ORDER — ENOXAPARIN SODIUM 40 MG/0.4ML ~~LOC~~ SOLN
40.0000 mg | SUBCUTANEOUS | Status: DC
  Administered 2019-05-04 – 2019-05-05 (×2): 40 mg via SUBCUTANEOUS
  Filled 2019-05-04 (×2): qty 0.4

## 2019-05-04 MED ORDER — ACETAMINOPHEN 325 MG PO TABS: 650.0000 mg | ORAL_TABLET | Freq: Four times a day (QID) | ORAL | Status: DC | PRN

## 2019-05-04 MED ORDER — ALBUTEROL SULFATE (2.5 MG/3ML) 0.083% IN NEBU: 2.5000 mg | INHALATION_SOLUTION | Freq: Four times a day (QID) | RESPIRATORY_TRACT | Status: DC | PRN

## 2019-05-04 MED ORDER — CHLORHEXIDINE GLUCONATE CLOTH 2 % EX PADS
6.0000 | MEDICATED_PAD | Freq: Every day | CUTANEOUS | Status: DC
  Administered 2019-05-04 – 2019-05-05 (×2): 6 via TOPICAL

## 2019-05-04 MED ORDER — SODIUM CHLORIDE 0.9 % IV SOLN
INTRAVENOUS | Status: DC
  Administered 2019-05-04: 125 mL/h via INTRAVENOUS

## 2019-05-04 MED ORDER — CALCIUM GLUCONATE-NACL 1-0.675 GM/50ML-% IV SOLN
1.0000 g | Freq: Once | INTRAVENOUS | Status: AC
  Administered 2019-05-04: 1000 mg via INTRAVENOUS
  Filled 2019-05-04: qty 50

## 2019-05-04 MED ORDER — ACETAMINOPHEN 650 MG RE SUPP: 650.0000 mg | Freq: Four times a day (QID) | RECTAL | Status: DC | PRN

## 2019-05-04 MED ORDER — MORPHINE SULFATE (PF) 2 MG/ML IV SOLN
2.0000 mg | INTRAVENOUS | Status: DC | PRN
  Administered 2019-05-04: 2 mg via INTRAVENOUS
  Filled 2019-05-04: qty 1

## 2019-05-04 MED ORDER — PREDNISONE 5 MG PO TABS: 5.0000 mg | ORAL_TABLET | Freq: Once | ORAL | Status: DC

## 2019-05-04 MED ORDER — OXYCODONE HCL 5 MG PO TABS
5.0000 mg | ORAL_TABLET | Freq: Four times a day (QID) | ORAL | Status: DC | PRN
  Administered 2019-05-04 (×2): 5 mg via ORAL
  Filled 2019-05-04 (×2): qty 1

## 2019-05-04 MED ORDER — PANTOPRAZOLE SODIUM 40 MG PO TBEC
40.0000 mg | DELAYED_RELEASE_TABLET | Freq: Every day | ORAL | Status: DC
  Administered 2019-05-05 – 2019-05-06 (×2): 40 mg via ORAL
  Filled 2019-05-04 (×2): qty 1

## 2019-05-04 MED ORDER — ASPIRIN EC 81 MG PO TBEC
81.0000 mg | DELAYED_RELEASE_TABLET | Freq: Every day | ORAL | Status: DC
  Administered 2019-05-04 – 2019-05-06 (×3): 81 mg via ORAL
  Filled 2019-05-04 (×3): qty 1

## 2019-05-04 MED ORDER — ONDANSETRON HCL 4 MG PO TABS: 4.0000 mg | ORAL_TABLET | Freq: Four times a day (QID) | ORAL | Status: DC | PRN

## 2019-05-04 MED ORDER — PREDNISONE 5 MG PO TABS
5.0000 mg | ORAL_TABLET | Freq: Every day | ORAL | Status: DC
  Administered 2019-05-05 – 2019-05-06 (×2): 5 mg via ORAL
  Filled 2019-05-04 (×4): qty 1

## 2019-05-04 MED ORDER — SODIUM CHLORIDE 0.9% FLUSH
3.0000 mL | Freq: Two times a day (BID) | INTRAVENOUS | Status: DC
  Administered 2019-05-05: 3 mL via INTRAVENOUS

## 2019-05-04 MED ORDER — FOLIC ACID 1 MG PO TABS
1.0000 mg | ORAL_TABLET | Freq: Every day | ORAL | Status: DC
  Administered 2019-05-04 – 2019-05-06 (×3): 1 mg via ORAL
  Filled 2019-05-04 (×3): qty 1

## 2019-05-04 MED ORDER — ONDANSETRON HCL 4 MG/2ML IJ SOLN: 4.0000 mg | Freq: Four times a day (QID) | INTRAMUSCULAR | Status: DC | PRN

## 2019-05-04 MED ORDER — SODIUM CHLORIDE 0.9 % IV SOLN: Freq: Once | INTRAVENOUS | Status: AC

## 2019-05-04 MED ORDER — PANTOPRAZOLE SODIUM 40 MG IV SOLR
40.0000 mg | Freq: Once | INTRAVENOUS | Status: AC
  Administered 2019-05-04: 40 mg via INTRAVENOUS
  Filled 2019-05-04: qty 40

## 2019-05-04 NOTE — ED Notes (Signed)
ED TO INPATIENT HANDOFF REPORT  ED Nurse Name and Phone  Daralene Milch, [place]  [phone]  S Name/Age/Gender [name] 83 y.o. male Room/Bed: [id number]  Code Status   Code Status: Not on file  Home/SNF/Other Home Patient oriented to: self, place, time and situation Is this baseline? Yes   Triage Complete: Triage complete  Chief Complaint NAUSEA AND GROIN PAIN  Triage Note Patient reports L hip pain x 4 days, no known injury, pain worse with movement. Also endorses sweats, chills, and nausea that began today. Denies vomiting, fevers/chills.   Allergies Allergies  Allergen Reactions  . Metoprolol Swelling    swelling of legs  . Morphine And Related Other (See Comments)    Diaphoretic and hypotensive    Level of Care/Admitting Diagnosis ED Disposition    ED Disposition Condition Comment   Admit  Hospital Area: [facility] [[id number]]  Level of Care: Med-Surg [16]  I expect the patient will be discharged within 24 hours: No (not a candidate for 5C-Observation unit)  Covid Evaluation: Asymptomatic Screening Protocol (No Symptoms)  Diagnosis: Left hip pain [id number]  Admitting Physician: Norval Morton [id number]  Attending Physician: Norval Morton [id number]  PT Class (Do Not Modify): Observation [104]  PT Acc Code (Do Not Modify): Observation [10022]       B Medical/Surgery History Past Medical History:  Diagnosis Date  . Arthritis    temporal  . Degeneration macular   . Hearing aid worn    Right  . Hx of biopsy    temporal artery  . Hx of tonsillectomy   . Prostate cancer ([place])   . Spondylosis    cervical   . Temporal arteritis ([facility] [place]) 12/20/2012   Past Surgical History:  Procedure Laterality Date  . radium seed implants for prostate cancer    . TONSILLECTOMY       A IV Location/Drains/Wounds Patient Lines/Drains/Airways Status   Active Line/Drains/Airways    Name:   Placement date:   Placement time:   Site:   Days:   Peripheral  IV 05/04/19 Left Forearm   05/04/19    0938    Forearm   less than 1          Intake/Output Last 24 hours No intake or output data in the 24 hours ending 05/04/19 1609  Labs/Imaging Results for orders placed or performed during the hospital encounter of 05/04/19 (from the past 48 hour(s))  Comprehensive metabolic panel     Status: Abnormal   Collection Time: 05/04/19  9:02 AM  Result Value Ref Range   Sodium 140 135 - 145 mmol/L   Potassium 3.7 3.5 - 5.1 mmol/L   Chloride 104 98 - 111 mmol/L   CO2 25 22 - 32 mmol/L   Glucose, Bld 104 (H) 70 - 99 mg/dL   BUN 27 (H) 8 - 23 mg/dL   Creatinine, Ser 1.42 (H) 0.61 - 1.24 mg/dL   Calcium 8.7 (L) 8.9 - 10.3 mg/dL   Total Protein 6.1 (L) 6.5 - 8.1 g/dL   Albumin 3.5 3.5 - 5.0 g/dL   AST 19 15 - 41 U/L   ALT 15 0 - 44 U/L   Alkaline Phosphatase 49 38 - 126 U/L   Total Bilirubin 0.7 0.3 - 1.2 mg/dL   GFR calc non Af Amer 46 (L) >60 mL/min   GFR calc Af Amer 53 (L) >60 mL/min   Anion gap 11 5 - 15    Comment: Performed at [facility], [place] [address], [place], [place] [address]  Lipase, blood     Status: None   Collection Time: 05/04/19  9:02 AM  Result Value Ref Range   Lipase 26 11 - 51 U/L    Comment: Performed at [facility] [address], [place], [place] [address]  CBC with Differential     Status: Abnormal   Collection Time: 05/04/19  9:02 AM  Result Value Ref Range   WBC 8.8 4.0 - 10.5 K/uL   RBC 3.84 (L) 4.22 - 5.81 MIL/uL   Hemoglobin 12.0 (L) 13.0 - 17.0 g/dL   HCT 37.0 (L) 39.0 - 52.0 %   MCV 96.4 80.0 - 100.0 fL   MCH 31.3 26.0 - 34.0 pg   MCHC 32.4 30.0 - 36.0 g/dL   RDW 14.7 11.5 - 15.5 %   Platelets 171 150 - 400 K/uL   nRBC 0.0 0.0 - 0.2 %   Neutrophils Relative % 77 %   Neutro Abs 6.8 1.7 - 7.7 K/uL   Lymphocytes Relative 12 %   Lymphs Abs 1.0 0.7 - 4.0 K/uL   Monocytes Relative 9 %   Monocytes Absolute 0.7 0.1 - 1.0 K/uL   Eosinophils Relative 2 %   Eosinophils Absolute 0.1 0.0 - 0.5 K/uL    Basophils Relative 0 %   Basophils Absolute 0.0 0.0 - 0.1 K/uL   Immature Granulocytes 0 %   Abs Immature Granulocytes 0.03 0.00 - 0.07 K/uL    Comment: Performed at [facility] [address] [place], [place] [address]  Sedimentation rate     Status: Abnormal   Collection Time: 05/04/19  9:02 AM  Result Value Ref Range   Sed Rate 29 (H) 0 - 16 mm/hr    Comment: Performed at [facility] [address] [place], [place] [address]  SARS Coronavirus 2 [facility] order, Performed in [facility] hospital lab) Nasopharyngeal Nasopharyngeal Swab     Status: None   Collection Time: 05/04/19 11:46 AM   Specimen: Nasopharyngeal Swab  Result Value Ref Range   SARS Coronavirus 2 NEGATIVE NEGATIVE    Comment: (NOTE) If result is NEGATIVE SARS-CoV-2 target nucleic acids are NOT DETECTED. The SARS-CoV-2 RNA is generally detectable in upper and lower  respiratory specimens during the acute phase of infection. The lowest  concentration of SARS-CoV-2 viral copies this assay can detect is 250  copies / mL. A negative result does not preclude SARS-CoV-2 infection  and should not be used as the sole basis for treatment or other  patient management decisions.  A negative result may occur with  improper specimen collection / handling, submission of specimen other  than nasopharyngeal swab, presence of viral mutation(s) within the  areas targeted by this assay, and inadequate number of viral copies  (<250 copies / mL). A negative result must be combined with clinical  observations, patient history, and epidemiological information. If result is POSITIVE SARS-CoV-2 target nucleic acids are DETECTED. The SARS-CoV-2 RNA is generally detectable in upper and lower  respiratory specimens dur ing the acute phase of infection.  Positive  results are indicative of active infection with SARS-CoV-2.  Clinical  correlation with patient history and other diagnostic information is  necessary to  determine patient infection status.  Positive results do  not rule out bacterial infection or co-infection with other viruses. If result is PRESUMPTIVE POSTIVE SARS-CoV-2 nucleic acids MAY BE PRESENT.   A presumptive positive result was obtained  on the submitted specimen  and confirmed on repeat testing.  While 2019 novel coronavirus  (SARS-CoV-2) nucleic acids may be present in the submitted sample  additional confirmatory testing may be necessary for epidemiological  and / or clinical management purposes  to differentiate between  SARS-CoV-2 and other Sarbecovirus currently known to infect humans.  If clinically indicated additional testing with an alternate test  methodology [phone]) is advised. The SARS-CoV-2 RNA is generally  detectable in upper and lower respiratory sp ecimens during the acute  phase of infection. The expected result is Negative. Fact Sheet for Patients:  StrictlyIdeas.no Fact Sheet for Healthcare Providers: BankingDealers.co.za This test is not yet approved or cleared by the [place] FDA and has been authorized for detection and/or diagnosis of SARS-CoV-2 by FDA under an Emergency Use Authorization (EUA).  This EUA will remain in effect (meaning this test can be used) for the duration of the COVID-19 declaration under Section 564(b)(1) of the Act, 21 U.S.C. section 360bbb-3(b)(1), unless the authorization is terminated or revoked sooner. Performed at [facility], [place] [address] [place], [place] [address]    Dg Hip Unilat With Pelvis 2-3 Views Left  Result Date: 05/04/2019 CLINICAL DATA:  Left hip pain EXAM: DG HIP (WITH OR WITHOUT PELVIS) 2-3V LEFT COMPARISON:  CT pelvis 07/18/2018 FINDINGS: Brachytherapy seed implants projecting over the prostate gland. Preserved articular space in the left hip. No appreciable fracture or acute bony findings. No compelling evidence of bony metastatic disease involving  the left hip. IMPRESSION: 1. No conventional radiographic findings to explain the patient's left hip pain over the last 4 days. 2. Prostate seed implants noted. Electronically Signed   By: Van Clines M.D.   On: 05/04/2019 09:55    Pending Labs Unresulted Labs (From admission, onward)    Start     Ordered   05/04/19 1458  Urinalysis, Routine w reflex microscopic  Once,   STAT     05/04/19 1457   05/04/19 1122  Body fluid culture  (Arthrocentesis Panel)  ONCE - STAT,   STAT    Question Answer Comment  Are there also cytology or pathology orders on this specimen? Yes   Patient immune status Immunocompromised      05/04/19 1122   05/04/19 1122  Glucose, Body Fluid Other  (Arthrocentesis Panel)  ONCE - STAT,   STAT     05/04/19 1122   05/04/19 1122  Protein, body fluid (other)  (Arthrocentesis Panel)  ONCE - STAT,   STAT     05/04/19 1122   05/04/19 1122  Synovial cell count + diff, w/ crystals  (Arthrocentesis Panel)  ONCE - STAT,   STAT     05/04/19 1122   05/04/19 1122  Gram stain  (Arthrocentesis Panel)  ONCE - STAT,   STAT    Question:  Patient immune status  Answer:  Immunocompromised   05/04/19 1122   05/04/19 1122  Uric Acid, Body Fluid  (Arthrocentesis Panel)  ONCE - STAT,   STAT     05/04/19 1122   05/04/19 0945  C-reactive protein  Once,   R     05/04/19 0945   Signed and Held  Urinalysis, Routine w reflex microscopic  Once,   R     Signed and Held   Signed and Held  CBC  Tomorrow morning,   R     Signed and Held   Signed and Held  Basic metabolic panel  Tomorrow morning,   R  Signed and Held          Vitals/Pain Today's Vitals   05/04/19 1358 05/04/19 1400 05/04/19 1430 05/04/19 1600  BP:  (!) 123/59 115/61 126/65  Pulse:  (!) 55 73 79  Resp:  12 20   Temp:      TempSrc:      SpO2:  96% 98% 95%  PainSc: 10-Worst pain ever       Isolation Precautions No active isolations  Medications Medications  0.9 %  sodium chloride infusion ( Intravenous  Transfusing/Transfer 05/04/19 1609)  calcium gluconate 1 g/ 50 mL sodium chloride IVPB (1,000 mg Intravenous Transfusing/Transfer 05/04/19 1609)  oxyCODONE (Oxy IR/ROXICODONE) immediate release tablet 5 mg (has no administration in time range)  pantoprazole (PROTONIX) EC tablet 40 mg (has no administration in time range)  pantoprazole (PROTONIX) injection 40 mg (40 mg Intravenous Given 05/04/19 1600)  gadobutrol (GADAVIST) 1 MMOL/ML injection 7 mL (7 mLs Intravenous Contrast Given 05/04/19 1541)    Mobility walks Low fall risk   Focused Assessments Left hip pain radiating down left leg x's 4 days   R Recommendations: See Admitting Provider Note  Report given to:   Additional Notes: Pt is alert and oriented x's 4.  Wife is with pt.

## 2019-05-04 NOTE — ED Notes (Addendum)
Patient reports becoming diaphoretic after receiving Morphine. Blood Pressure decreased with medication. Episode has subsided but patient reports medication did not help pain.

## 2019-05-04 NOTE — H&P (Addendum)
History and Physical    [name] HGD:[id number] DOB: 17-Jan-1936 DOA: 05/04/2019  Referring MD/NP/PA: Wyn Quaker, PA-C Consultants: Dr. Jannifer Franklin neurology, Dr. Amil Amen rheumatology PCP: Venetia Maxon, Sharon Mt, MD  Patient coming from: Home  Chief Complaint: Left hip pain  I have personally briefly reviewed patient's old medical records in [place]   HPI: [name] is a 83 y.o. male with medical history significant of nonsustained VT, temporal arteritis on chronic steroids, prostate cancer s/p radioactive seed placement, and hearing loss; who presents with complaints of left hip pain for the last 3 to 4 days.  He denies having any recent falls or trauma.  Pain was initially described as soreness in the left hip.  Movement and trying to bear weight on left hip worsens symptoms.  Associated symptoms include some nausea, malaise, lightheadedness, and chills. He had been taking extra strength Tylenol at home without any relief of symptoms. Denies having any significant fevers, chest pain, shortness of breath, cough, abdominal pain, vomiting, diarrhea, or dysuria symptoms.  Patient is on methotrexate and prednisone for treatment of his temporal arteritis.  It was initially diagnosed approximately 8 years ago and has been on prednisone since that time.  His prednisone dose was just recently decreased from 6 mg down to 5 mg on 9/1.  Patient reports having issues with previous decreases in his prednisone.  He had seen Dr. Amil Amen of rheumatology 2 days ago who had check CRP and ESR and noted that they were elevated.  However, did not recommend any change in his prednisone dose at that time.  ED Course: On admission into the emergency department patient was seen to be afebrile with vital signs relatively within normal limits.  Labs revealed WBC 8.8, hemoglobin 12, BUN 27, creatinine 1.42, calcium 8.7, and ESR 29.  COVID 19 screening negative.  X-rays of the hip showed no acute  abnormalities.  Orthopedics was consulted and recommended obtaining MRI of the left hip and interventional radiology to tap the left hip joint.  However, MRI and tap of the joint not likely to take place until tomorrow.  Orthopedics recommended holding antibiotics until procedure.  Patient was started on normal saline IV fluids.  In the ED patient also received morphine, but developed transient hypotension.  Morphine was added to patient's allergy list.  TRH called to admit.  Review of systems: A complete 10 point review of systems was performed and negative except for as noted above in HPI  Past Medical History:  Diagnosis Date  . Arthritis    temporal  . Degeneration macular   . Hearing aid worn    Right  . Hx of biopsy    temporal artery  . Hx of tonsillectomy   . Prostate cancer ([place])   . Spondylosis    cervical   . Temporal arteritis ([place]) 12/20/2012    Past Surgical History:  Procedure Laterality Date  . radium seed implants for prostate cancer    . TONSILLECTOMY       reports that he has never smoked. He has never used smokeless tobacco. He reports that he does not drink alcohol or use drugs.  Allergies  Allergen Reactions  . Metoprolol Swelling    swelling of legs  . Morphine And Related Other (See Comments)    Diaphoretic and hypotensive    Family History  Problem Relation Age of Onset  . Stroke Sister   . Seizures Sister   . Heart disease Brother  Prior to Admission medications   Medication Sig Start Date End Date Taking? Authorizing Provider  aspirin 81 MG tablet Take 81 mg by mouth daily. One tablet    Yes [provider]  B Complex Vitamins (B COMPLEX 50) TABS Take 1 tablet by mouth daily.   Yes [provider]  Calcium Carbonate-Vitamin D (CALTRATE 600+D PO) Take 1 tablet by mouth 2 (two) times daily.    Yes [provider]  cetirizine (ZYRTEC) 10 MG tablet Take 10 mg by mouth daily.   Yes [provider]   cholecalciferol (VITAMIN D) 1000 UNITS tablet Take 800 Units by mouth 2 (two) times daily.    Yes [provider]  folic acid (FOLVITE) 1 MG tablet Take 1 mg by mouth daily. 02/18/17  Yes [provider]  methotrexate (RHEUMATREX) 2.5 MG tablet Take 10 mg by mouth once a week. Friday 01/27/19  Yes [provider]  Multiple Vitamins-Minerals (PRESERVISION AREDS 2 PO) Take by mouth 2 (two) times daily.   Yes [provider]  predniSONE (DELTASONE) 5 MG tablet Take 1 tablet (5 mg total) by mouth daily with breakfast. 04/28/18  Yes Kathrynn Ducking, MD  predniSONE (DELTASONE) 1 MG tablet 4 TABLETS DAILY FOR 6 WEEKS THEN TAKE 3 TABLETS DAILY Patient not taking: Reported on 05/04/2019 10/19/18   Kathrynn Ducking, MD    Physical Exam:  Constitutional: NAD, calm, comfortable Vitals:   05/04/19 1315 05/04/19 1346 05/04/19 1357 05/04/19 1400  BP: 103/60 (!) 143/72 135/60 (!) 123/59  Pulse: (!) 55 66 66 (!) 55  Resp: 20 (!) 22 19 12   Temp:      TempSrc:      SpO2: 96% 96% 98% 96%   Eyes: PERRL, lids and conjunctivae normal ENMT: Mucous membranes are moist. Posterior pharynx clear of any exudate or lesions.hearing aid present. Neck: normal, supple, no masses, no thyromegaly Respiratory: clear to auscultation bilaterally, no wheezing, no crackles. Normal respiratory effort. No accessory muscle use.  Cardiovascular: Irregular. No extremity edema. 2+ pedal pulses. No carotid bruits.  Abdomen: no tenderness, no masses palpated. No hepatosplenomegaly. Bowel sounds positive.  Musculoskeletal: no clubbing / cyanosis.  Decreased range of motion of the left hip due to pain. Skin: No significant rashes or bruises noted. Neurologic: CN 2-12 grossly intact. Sensation intact, DTR normal. Strength 5/5 in all 4.  Psychiatric: Normal judgment and insight. Alert and oriented x 3. Normal mood.     Labs on Admission: I have personally reviewed following labs and imaging  studies  CBC: Recent Labs  Lab 05/04/19 0902  WBC 8.8  NEUTROABS 6.8  HGB 12.0*  HCT 37.0*  MCV 96.4  PLT 984   Basic Metabolic Panel: Recent Labs  Lab 05/04/19 0902  NA 140  K 3.7  CL 104  CO2 25  GLUCOSE 104*  BUN 27*  CREATININE 1.42*  CALCIUM 8.7*   GFR: CrCl cannot be calculated (Unknown ideal weight.). Liver Function Tests: Recent Labs  Lab 05/04/19 0902  AST 19  ALT 15  ALKPHOS 49  BILITOT 0.7  PROT 6.1*  ALBUMIN 3.5   Recent Labs  Lab 05/04/19 0902  LIPASE 26   No results for input(s): AMMONIA in the last 168 hours. Coagulation Profile: No results for input(s): INR, PROTIME in the last 168 hours. Cardiac Enzymes: No results for input(s): CKTOTAL, CKMB, CKMBINDEX, TROPONINI in the last 168 hours. BNP (last 3 results) No results for input(s): PROBNP in the last 8760 hours. HbA1C: No  results for input(s): HGBA1C in the last 72 hours. CBG: No results for input(s): GLUCAP in the last 168 hours. Lipid Profile: No results for input(s): CHOL, HDL, LDLCALC, TRIG, CHOLHDL, LDLDIRECT in the last 72 hours. Thyroid Function Tests: No results for input(s): TSH, T4TOTAL, FREET4, T3FREE, THYROIDAB in the last 72 hours. Anemia Panel: No results for input(s): VITAMINB12, FOLATE, FERRITIN, TIBC, IRON, RETICCTPCT in the last 72 hours. Urine analysis:    Component Value Date/Time   APPEARANCEUR Clear 12/14/2016 1327   GLUCOSEU Negative 12/14/2016 1327   BILIRUBINUR Negative 12/14/2016 1327   PROTEINUR Negative 12/14/2016 1327   NITRITE Negative 12/14/2016 1327   LEUKOCYTESUR Negative 12/14/2016 1327   Sepsis Labs: Recent Results (from the past 240 hour(s))  SARS Coronavirus 2 [facility] order, Performed in [facility] hospital lab) Nasopharyngeal Nasopharyngeal Swab     Status: None   Collection Time: 05/04/19 11:46 AM   Specimen: Nasopharyngeal Swab  Result Value Ref Range Status   SARS Coronavirus 2 NEGATIVE NEGATIVE Final    Comment: (NOTE) If  result is NEGATIVE SARS-CoV-2 target nucleic acids are NOT DETECTED. The SARS-CoV-2 RNA is generally detectable in upper and lower  respiratory specimens during the acute phase of infection. The lowest  concentration of SARS-CoV-2 viral copies this assay can detect is 250  copies / mL. A negative result does not preclude SARS-CoV-2 infection  and should not be used as the sole basis for treatment or other  patient management decisions.  A negative result may occur with  improper specimen collection / handling, submission of specimen other  than nasopharyngeal swab, presence of viral mutation(s) within the  areas targeted by this assay, and inadequate number of viral copies  (<250 copies / mL). A negative result must be combined with clinical  observations, patient history, and epidemiological information. If result is POSITIVE SARS-CoV-2 target nucleic acids are DETECTED. The SARS-CoV-2 RNA is generally detectable in upper and lower  respiratory specimens dur ing the acute phase of infection.  Positive  results are indicative of active infection with SARS-CoV-2.  Clinical  correlation with patient history and other diagnostic information is  necessary to determine patient infection status.  Positive results do  not rule out bacterial infection or co-infection with other viruses. If result is PRESUMPTIVE POSTIVE SARS-CoV-2 nucleic acids MAY BE PRESENT.   A presumptive positive result was obtained on the submitted specimen  and confirmed on repeat testing.  While 2019 novel coronavirus  (SARS-CoV-2) nucleic acids may be present in the submitted sample  additional confirmatory testing may be necessary for epidemiological  and / or clinical management purposes  to differentiate between  SARS-CoV-2 and other Sarbecovirus currently known to infect humans.  If clinically indicated additional testing with an alternate test  methodology [phone]) is advised. The SARS-CoV-2 RNA is generally   detectable in upper and lower respiratory sp ecimens during the acute  phase of infection. The expected result is Negative. Fact Sheet for Patients:  StrictlyIdeas.no Fact Sheet for Healthcare Providers: BankingDealers.co.za This test is not yet approved or cleared by the [place] FDA and has been authorized for detection and/or diagnosis of SARS-CoV-2 by FDA under an Emergency Use Authorization (EUA).  This EUA will remain in effect (meaning this test can be used) for the duration of the COVID-19 declaration under Section 564(b)(1) of the Act, 21 U.S.C. section 360bbb-3(b)(1), unless the authorization is terminated or revoked sooner. Performed at [facility], [place] [address], [place], [place] [address]  Radiological Exams on Admission: Dg Hip Unilat With Pelvis 2-3 Views Left  Result Date: 05/04/2019 CLINICAL DATA:  Left hip pain EXAM: DG HIP (WITH OR WITHOUT PELVIS) 2-3V LEFT COMPARISON:  CT pelvis 07/18/2018 FINDINGS: Brachytherapy seed implants projecting over the prostate gland. Preserved articular space in the left hip. No appreciable fracture or acute bony findings. No compelling evidence of bony metastatic disease involving the left hip. IMPRESSION: 1. No conventional radiographic findings to explain the patient's left hip pain over the last 4 days. 2. Prostate seed implants noted. Electronically Signed   By: Van Clines M.D.   On: 05/04/2019 09:55    EKG: Independently reviewed.  Sinus rhythm at 64 bpm  Assessment/Plan Left hip pain: Acute.  Patient presents with left hip pain and difficulty bearing weight on the affected side.  ESR 29 and CRP 7.  MRI was able to be obtained and revealed concerns for high-grade muscle strain versus myositis.  Septic arthritis appears less likely.  IR consult had already been placed though for need of aspiration. -Admit to MedSurg bed -N.p.o. after midnight -Oxycodone PRN  pain -Physical therapy to evaluate and treat -Dr. Marlou Sa of orthopedics consulted, will follow-up for further recommendations including need of IR. -Empiric antibiotics were recommended to be held  Renal insufficiency: Patient's baseline creatinine previously noted to be around 1.25, but presents with creatinine elevated up to 1.42 with BUN 27. -Check urinalysis -Continue IV fluids overnight -Recheck BMP in a.m.  Irregular heart rhythm: On physical exam patient heart rhythm was noted to be irregular.  EKG noting sinus bradycardia.  They report previously being evaluated in the past by Dr. Geraldo Pitter and being told that he had atrial fibrillation.  However,records show patient was diagnosed with palpitations and nonsustained VT back in 11/2016 and was recommended to start on a low-dose beta-blocker. -Recheck EKG -Continue aspirin  Temporal arteritis on chronic immunosuppressants: Patient on chronic steroids and methotrexate. -Continue prednisone -Restart methotrexate at discharge  Nausea: Acute.  Unclear cause of nausea. -Protonix -Antiemetics as needed  Normocytic anemia: Stable.  Hemoglobin 12. -Recheck CBC in a.m.  Prostate cancer, status post radioactive seed placement   DVT prophylaxis:   Lovenox Code Status: Full Family Communication: Discussed plan of care with the patient's wife present at bedside Disposition Plan: Possible discharge home if work-up negative Consults called: Ortho, IR Admission status:observation  Norval Morton MD [facility] Pager [phone]   If 7PM-7AM, please contact night-coverage [email or web address] Password [facility]  05/04/2019, 2:33 PM

## 2019-05-04 NOTE — ED Triage Notes (Signed)
Patient reports L hip pain x 4 days, no known injury, pain worse with movement. Also endorses sweats, chills, and nausea that began today. Denies vomiting, fevers/chills.

## 2019-05-04 NOTE — ED Notes (Signed)
EDP notified of patient reaction to Morphine, Order discontinued, allergies updated.

## 2019-05-04 NOTE — Consult Note (Signed)
Reason for Consult:Left hip pain Referring Physician: R Kalab [name] is an 83 y.o. male.  HPI: [name] came to the ED with a 4-5d hx/o left hip pain. It started gradually and steadily got worse. It got bad enough that it prompted him to come to the ED for evaluation. He denies any prior hx/o similar episodes. He denies known fevers but did have chills and sweats last night and nausea without emesis this morning.  Past Medical History:  Diagnosis Date  . Arthritis    temporal  . Degeneration macular   . Hearing aid worn    Right  . Hx of biopsy    temporal artery  . Hx of tonsillectomy   . Prostate cancer ([place])   . Spondylosis    cervical   . Temporal arteritis ([place]) 12/20/2012    Past Surgical History:  Procedure Laterality Date  . radium seed implants for prostate cancer    . TONSILLECTOMY      Family History  Problem Relation Age of Onset  . Stroke Sister   . Seizures Sister   . Heart disease Brother     Social History:  reports that he has never smoked. He has never used smokeless tobacco. He reports that he does not drink alcohol or use drugs.  Allergies:  Allergies  Allergen Reactions  . Metoprolol Swelling    swelling of legs  . Morphine And Related Other (See Comments)    Diaphoretic and hypotensive    Medications: I have reviewed the patient's current medications.  Results for orders placed or performed during the hospital encounter of 05/04/19 (from the past 48 hour(s))  Comprehensive metabolic panel     Status: Abnormal   Collection Time: 05/04/19  9:02 AM  Result Value Ref Range   Sodium 140 135 - 145 mmol/L   Potassium 3.7 3.5 - 5.1 mmol/L   Chloride 104 98 - 111 mmol/L   CO2 25 22 - 32 mmol/L   Glucose, Bld 104 (H) 70 - 99 mg/dL   BUN 27 (H) 8 - 23 mg/dL   Creatinine, Ser 1.42 (H) 0.61 - 1.24 mg/dL   Calcium 8.7 (L) 8.9 - 10.3 mg/dL   Total Protein 6.1 (L) 6.5 - 8.1 g/dL   Albumin 3.5 3.5 - 5.0 g/dL   AST 19 15 - 41 U/L   ALT 15 0 -  44 U/L   Alkaline Phosphatase 49 38 - 126 U/L   Total Bilirubin 0.7 0.3 - 1.2 mg/dL   GFR calc non Af Amer 46 (L) >60 mL/min   GFR calc Af Amer 53 (L) >60 mL/min   Anion gap 11 5 - 15    Comment: Performed at [facility], [address] [place], [place] [address]  Lipase, blood     Status: None   Collection Time: 05/04/19  9:02 AM  Result Value Ref Range   Lipase 26 11 - 51 U/L    Comment: Performed at [facility] [address], [place], [place] [address]  CBC with Differential     Status: Abnormal   Collection Time: 05/04/19  9:02 AM  Result Value Ref Range   WBC 8.8 4.0 - 10.5 K/uL   RBC 3.84 (L) 4.22 - 5.81 MIL/uL   Hemoglobin 12.0 (L) 13.0 - 17.0 g/dL   HCT 37.0 (L) 39.0 - 52.0 %   MCV 96.4 80.0 - 100.0 fL   MCH 31.3 26.0 - 34.0 pg   MCHC  32.4 30.0 - 36.0 g/dL   RDW 14.7 11.5 - 15.5 %   Platelets 171 150 - 400 K/uL   nRBC 0.0 0.0 - 0.2 %   Neutrophils Relative % 77 %   Neutro Abs 6.8 1.7 - 7.7 K/uL   Lymphocytes Relative 12 %   Lymphs Abs 1.0 0.7 - 4.0 K/uL   Monocytes Relative 9 %   Monocytes Absolute 0.7 0.1 - 1.0 K/uL   Eosinophils Relative 2 %   Eosinophils Absolute 0.1 0.0 - 0.5 K/uL   Basophils Relative 0 %   Basophils Absolute 0.0 0.0 - 0.1 K/uL   Immature Granulocytes 0 %   Abs Immature Granulocytes 0.03 0.00 - 0.07 K/uL    Comment: Performed at [facility] [address], [place], [place] [address]  Sedimentation rate     Status: Abnormal   Collection Time: 05/04/19  9:02 AM  Result Value Ref Range   Sed Rate 29 (H) 0 - 16 mm/hr    Comment: Performed at [facility] [address] [place], [place] [address]  SARS Coronavirus 2 [facility] order, Performed in [facility] hospital lab) Nasopharyngeal Nasopharyngeal Swab     Status: None   Collection Time: 05/04/19 11:46 AM   Specimen: Nasopharyngeal Swab  Result Value Ref Range   SARS Coronavirus 2 NEGATIVE NEGATIVE    Comment: (NOTE) If result is NEGATIVE SARS-CoV-2 target  nucleic acids are NOT DETECTED. The SARS-CoV-2 RNA is generally detectable in upper and lower  respiratory specimens during the acute phase of infection. The lowest  concentration of SARS-CoV-2 viral copies this assay can detect is 250  copies / mL. A negative result does not preclude SARS-CoV-2 infection  and should not be used as the sole basis for treatment or other  patient management decisions.  A negative result may occur with  improper specimen collection / handling, submission of specimen other  than nasopharyngeal swab, presence of viral mutation(s) within the  areas targeted by this assay, and inadequate number of viral copies  (<250 copies / mL). A negative result must be combined with clinical  observations, patient history, and epidemiological information. If result is POSITIVE SARS-CoV-2 target nucleic acids are DETECTED. The SARS-CoV-2 RNA is generally detectable in upper and lower  respiratory specimens dur ing the acute phase of infection.  Positive  results are indicative of active infection with SARS-CoV-2.  Clinical  correlation with patient history and other diagnostic information is  necessary to determine patient infection status.  Positive results do  not rule out bacterial infection or co-infection with other viruses. If result is PRESUMPTIVE POSTIVE SARS-CoV-2 nucleic acids MAY BE PRESENT.   A presumptive positive result was obtained on the submitted specimen  and confirmed on repeat testing.  While 2019 novel coronavirus  (SARS-CoV-2) nucleic acids may be present in the submitted sample  additional confirmatory testing may be necessary for epidemiological  and / or clinical management purposes  to differentiate between  SARS-CoV-2 and other Sarbecovirus currently known to infect humans.  If clinically indicated additional testing with an alternate test  methodology [phone]) is advised. The SARS-CoV-2 RNA is generally  detectable in upper and lower  respiratory sp ecimens during the acute  phase of infection. The expected result is Negative. Fact Sheet for Patients:  StrictlyIdeas.no Fact Sheet for Healthcare Providers: BankingDealers.co.za This test is not yet approved or cleared by the [place] FDA and has been authorized for detection and/or diagnosis of SARS-CoV-2 by FDA  under an Emergency Use Authorization (EUA).  This EUA will remain in effect (meaning this test can be used) for the duration of the COVID-19 declaration under Section 564(b)(1) of the Act, 21 U.S.C. section 360bbb-3(b)(1), unless the authorization is terminated or revoked sooner. Performed at [facility], [place] [address] [place], [place] [address]     Mr Hip Left W Wo Contrast  Result Date: 05/04/2019 CLINICAL DATA:  Left hip pain and swelling EXAM: MRI OF THE LEFT HIP WITHOUT AND WITH CONTRAST TECHNIQUE: Multiplanar, multisequence MR imaging was performed both before and after administration of intravenous contrast. CONTRAST:  30mL GADAVIST GADOBUTROL 1 MMOL/ML IV SOLN COMPARISON:  X-ray 05/04/2019.  CT 07/18/2018 FINDINGS: Bones: No acute fracture. No dislocation. No femoral head AVN. No bone marrow edema, cortical destruction, or focal bone lesion. Mild discogenic endplate marrow changes at L5-S1. Articular cartilage and labrum Articular cartilage: Cartilage thinning and surface irregularity of the superior acetabulum. Labrum:  Anterosuperior labral degeneration and complex tearing. Joint or bursal effusion Joint effusion: Trace bilateral hip joint effusions. No pericapsular edema or enhancement. Bursae: Trace left iliopsoas bursal fluid. Minimal bilateral peritrochanteric bursal edema. Muscles and tendons Muscles and tendons: Extensive intramuscular edema like signal within the left pectineus, adductor longus, and adductor magnus muscle bellies. No well-defined intramuscular fluid collections or areas of non  enhancement to suggest intramuscular abscess or myonecrosis. Other findings Miscellaneous: Fusiform enlargement of the distal abdominal aorta measuring 2.5 cm in diameter. IMPRESSION: 1. Trace bilateral hip joint effusions. No pericapsular edema or enhancement to suggest septic arthritis. However, if high clinical suspicion remains, consider arthrocentesis. 2. Extensive intramuscular edema like signal within the left pectineus, adductor longus, and adductor magnus muscle bellies. Findings are nonspecific but could reflect myositis versus a high-grade muscle strain. No well-defined intramuscular fluid collections or areas of non-enhancement to suggest pyomyositis/intramuscular abscess or myonecrosis. Electronically Signed   By: Davina Poke M.D.   On: 05/04/2019 16:17   Dg Hip Unilat With Pelvis 2-3 Views Left  Result Date: 05/04/2019 CLINICAL DATA:  Left hip pain EXAM: DG HIP (WITH OR WITHOUT PELVIS) 2-3V LEFT COMPARISON:  CT pelvis 07/18/2018 FINDINGS: Brachytherapy seed implants projecting over the prostate gland. Preserved articular space in the left hip. No appreciable fracture or acute bony findings. No compelling evidence of bony metastatic disease involving the left hip. IMPRESSION: 1. No conventional radiographic findings to explain the patient's left hip pain over the last 4 days. 2. Prostate seed implants noted. Electronically Signed   By: Van Clines M.D.   On: 05/04/2019 09:55    Review of Systems  Constitutional: Positive for chills. Negative for weight loss.  HENT: Negative for ear discharge, ear pain, hearing loss and tinnitus.   Eyes: Negative for blurred vision, double vision, photophobia and pain.  Respiratory: Negative for cough, sputum production and shortness of breath.   Cardiovascular: Negative for chest pain.  Gastrointestinal: Positive for nausea. Negative for abdominal pain and vomiting.  Genitourinary: Negative for dysuria, flank pain, frequency and urgency.   Musculoskeletal: Positive for joint pain (Left hip). Negative for back pain, falls, myalgias and neck pain.  Neurological: Negative for dizziness, tingling, sensory change, focal weakness, loss of consciousness and headaches.  Endo/Heme/Allergies: Does not bruise/bleed easily.  Psychiatric/Behavioral: Negative for depression, memory loss and substance abuse. The patient is not nervous/anxious.    Blood pressure 126/65, pulse 79, temperature 97.9 F (36.6 C), temperature source Oral, resp. rate 20, SpO2 95 %. Physical Exam  Constitutional: He appears well-developed  and well-nourished. No distress.  HENT:  Head: Normocephalic and atraumatic.  Eyes: Conjunctivae are normal. Right eye exhibits no discharge. Left eye exhibits no discharge. No scleral icterus.  Neck: Normal range of motion.  Cardiovascular: Normal rate and regular rhythm.  Respiratory: Effort normal. No respiratory distress.  Musculoskeletal:     Comments: LLE No traumatic wounds, ecchymosis, or rash  Mild TTP, severe pain with small amount PROM  No knee or ankle effusion  Knee stable to varus/ valgus and anterior/posterior stress  Sens DPN, SPN, TN intact  Motor EHL, ext, flex, evers 5/5  DP 2+, PT 2+, No significant edema  Neurological: He is alert.  Skin: Skin is warm and dry. He is not diaphoretic.  Psychiatric: He has a normal mood and affect. His behavior is normal.    Assessment/Plan: Left hip pain -- The MRI is not impressive and does not suggest septic joint but his clinical picture is worrisome. Will have Dr. Marlou Sa evaluate, please keep NPO for now. Temporal arteritis    Lisette Abu, PA-C Orthopedic Surgery [phone] 05/04/2019, 4:22 PM

## 2019-05-04 NOTE — ED Provider Notes (Signed)
[facility] EMERGENCY DEPARTMENT Provider Note   CSN: [id number] Arrival date & time: 05/04/19  5284     History   Chief Complaint Chief Complaint  Patient presents with  . Hip Pain  . Nausea    HPI [name] is a 83 y.o. male with a past medical history of temporal arteritis on prednisone and methotrexate, prostate cancer, who presents today for evaluation of left hip pain.  He reports that over the past 3 to 4 days he has had worsening pain in his left-sided hip/lower buttock.  He denies any trauma.  He reports that the pain is made worse with movement and made better with being still.    He reports that on 9/1 his prednisone dose was decreased from 6 mg down to 5 mg.  He did not take his dose of prednisone this morning.  He reports that since he woke up today he has developed chills, feeling lightheaded and nausea.  He denies any chest pain or shortness of breath.  He denies fevers.  He reports that he feels the same as he did approximately 3 years ago when his prednisone was decreased.  He denies any cough.  He denies vomiting or diarrhea.  No abdominal pain.     HPI  Past Medical History:  Diagnosis Date  . Arthritis    temporal  . Degeneration macular   . Hearing aid worn    Right  . Hx of biopsy    temporal artery  . Hx of tonsillectomy   . Prostate cancer ([place])   . Spondylosis    cervical   . Temporal arteritis ([place]) 12/20/2012    Patient Active Problem List   Diagnosis Date Noted  . Left hip pain 05/04/2019  . Temporal arteritis ([place]) 12/20/2012    Past Surgical History:  Procedure Laterality Date  . radium seed implants for prostate cancer    . TONSILLECTOMY          Home Medications    Prior to Admission medications   Medication Sig Start Date End Date Taking? Authorizing Provider  aspirin 81 MG tablet Take 81 mg by mouth daily. One tablet    Yes [provider]  B Complex Vitamins (B COMPLEX 50) TABS Take 1  tablet by mouth daily.   Yes [provider]  Calcium Carbonate-Vitamin D (CALTRATE 600+D PO) Take 1 tablet by mouth 2 (two) times daily.    Yes [provider]  cetirizine (ZYRTEC) 10 MG tablet Take 10 mg by mouth daily.   Yes [provider]  cholecalciferol (VITAMIN D) 1000 UNITS tablet Take 800 Units by mouth 2 (two) times daily.    Yes [provider]  folic acid (FOLVITE) 1 MG tablet Take 1 mg by mouth daily. 02/18/17  Yes [provider]  methotrexate (RHEUMATREX) 2.5 MG tablet Take 10 mg by mouth once a week. Friday 01/27/19  Yes [provider]  Multiple Vitamins-Minerals (PRESERVISION AREDS 2 PO) Take by mouth 2 (two) times daily.   Yes [provider]  predniSONE (DELTASONE) 5 MG tablet Take 1 tablet (5 mg total) by mouth daily with breakfast. 04/28/18  Yes Kathrynn Ducking, MD  predniSONE (DELTASONE) 1 MG tablet 4 TABLETS DAILY FOR 6 WEEKS THEN TAKE 3 TABLETS DAILY Patient not taking: Reported on 05/04/2019 10/19/18   Kathrynn Ducking, MD    Family History Family History  Problem Relation Age of Onset  . Stroke Sister   .  Seizures Sister   . Heart disease Brother     Social History Social History   Tobacco Use  . Smoking status: Never Smoker  . Smokeless tobacco: Never Used  Substance Use Topics  . Alcohol use: No  . Drug use: No     Allergies   Metoprolol and Morphine and related   Review of Systems Review of Systems  Constitutional: Positive for chills. Negative for fever.  HENT: Negative for congestion.   Respiratory: Negative for chest tightness and shortness of breath.   Cardiovascular: Negative for chest pain.  Gastrointestinal: Positive for nausea. Negative for abdominal pain and vomiting.  Genitourinary: Negative for dysuria, hematuria and urgency.  Musculoskeletal: Negative for back pain and neck pain.       Left hip pain  Neurological: Positive for light-headedness. Negative for weakness.   Psychiatric/Behavioral: Negative for confusion. The patient is not nervous/anxious.   All other systems reviewed and are negative.    Physical Exam Updated Vital Signs BP 115/61   Pulse 73   Temp 97.9 F (36.6 C) (Oral)   Resp 20   SpO2 98%   Physical Exam Vitals signs and nursing note reviewed.  Constitutional:      General: He is not in acute distress.    Appearance: He is well-developed. He is not diaphoretic.  HENT:     Head: Normocephalic and atraumatic.  Eyes:     General: No scleral icterus.       Right eye: No discharge.        Left eye: No discharge.     Conjunctiva/sclera: Conjunctivae normal.  Neck:     Musculoskeletal: Normal range of motion.  Cardiovascular:     Rate and Rhythm: Normal rate and regular rhythm.     Pulses: Normal pulses.     Heart sounds: Normal heart sounds.  Pulmonary:     Effort: Pulmonary effort is normal. No respiratory distress.     Breath sounds: Normal breath sounds. No stridor.  Abdominal:     General: Abdomen is flat. There is no distension.     Palpations: Abdomen is soft.     Tenderness: There is no abdominal tenderness.  Musculoskeletal:        General: No deformity.     Comments: Left lower extremity: There is pain in the left hip/buttock with internal and external rotation of the hip, worse with internal.  There is pain in the left hip with hip flexion.  There is no tenderness to palpation in the left knee, lower leg, foot or ankle.  There is no edema in the left lower extremity.  5/5 strength left ankle dorsiflexion and plantar flexion. T/L-spine without midline or paraspinal muscle tenderness to palpation.  No tenderness to palpation in left buttock.  Skin:    General: Skin is warm and dry.     Comments: There is no ecchymosis, redness, laceration, or wound present on the left lateral leg where patient is painful.  Neurological:     General: No focal deficit present.     Mental Status: He is alert.     Sensory: No  sensory deficit (Intact to light touch left leg.).     Motor: No abnormal muscle tone.  Psychiatric:        Behavior: Behavior normal.      ED Treatments / Results  Labs (all labs ordered are listed, but only abnormal results are displayed) Labs Reviewed  COMPREHENSIVE METABOLIC PANEL - Abnormal; Notable for the following components:  Result Value   Glucose, Bld 104 (*)    BUN 27 (*)    Creatinine, Ser 1.42 (*)    Calcium 8.7 (*)    Total Protein 6.1 (*)    GFR calc non Af Amer 46 (*)    GFR calc Af Amer 53 (*)    All other components within normal limits  CBC WITH DIFFERENTIAL/PLATELET - Abnormal; Notable for the following components:   RBC 3.84 (*)    Hemoglobin 12.0 (*)    HCT 37.0 (*)    All other components within normal limits  SEDIMENTATION RATE - Abnormal; Notable for the following components:   Sed Rate 29 (*)    All other components within normal limits  SARS CORONAVIRUS 2 (HOSPITAL ORDER, PERFORMED [facility])  BODY FLUID CULTURE  GRAM STAIN  LIPASE, BLOOD  C-REACTIVE PROTEIN  GLUCOSE, BODY FLUID OTHER  PROTEIN, BODY FLUID (OTHER)  SYNOVIAL CELL COUNT + DIFF, W/ CRYSTALS  URIC ACID, BODY FLUID  URINALYSIS, ROUTINE W REFLEX MICROSCOPIC    EKG EKG Interpretation  Date/Time:  Thursday May 04 2019 09:16:57 EDT Ventricular Rate:  64 PR Interval:    QRS Duration: 111 QT Interval:  410 QTC Calculation: 423 R Axis:   22 Text Interpretation:  Sinus rhythm Confirmed by Madalyn Rob [phone]) on 05/04/2019 9:23:59 AM   Radiology Dg Hip Unilat With Pelvis 2-3 Views Left  Result Date: 05/04/2019 CLINICAL DATA:  Left hip pain EXAM: DG HIP (WITH OR WITHOUT PELVIS) 2-3V LEFT COMPARISON:  CT pelvis 07/18/2018 FINDINGS: Brachytherapy seed implants projecting over the prostate gland. Preserved articular space in the left hip. No appreciable fracture or acute bony findings. No compelling evidence of bony metastatic disease  involving the left hip. IMPRESSION: 1. No conventional radiographic findings to explain the patient's left hip pain over the last 4 days. 2. Prostate seed implants noted. Electronically Signed   By: Van Clines M.D.   On: 05/04/2019 09:55    Procedures Procedures (including critical care time)  Medications Ordered in ED Medications  0.9 %  sodium chloride infusion (125 mL/hr Intravenous New Bag/Given 05/04/19 1317)  calcium gluconate 1 g/ 50 mL sodium chloride IVPB (has no administration in time range)  pantoprazole (PROTONIX) injection 40 mg (has no administration in time range)     Initial Impression / Assessment and Plan / ED Course  I have reviewed the triage vital signs and the nursing notes.  Pertinent labs & imaging results that were available during my care of the patient were reviewed by me and considered in my medical decision making (see chart for details).  Clinical Course as of May 03 1513  Thu May 04, 2019  1053 According to chart review on January 25, 2019 his ESR was 28   [EH]  1113 Jaci Standard PA-C with orthopedics recommends MRI, consulting IR for aspiration and obtaining Gram stain, culture and sensitivity, and cell count.   [EH]  1228 Attempted to call [facility]ology.  They transferred me to voice mail.  Paged IR Dr. On call.  IR Dr. Earleen Newport assisted in locating correct order, says it is not really a IR procedure, is diagnostic radiology.    [EH]  1250 Spoke with radiology, they report that typically these procedures are not known after 3 PM and that the doctor wishes for MRI to be done first to make sure there is adequate fluid in the joint to sample.  MRI is reportedly backed up and this will most  likely not easily be done today.   [EH]  41 Was informed by RN that patient became hypotensive and diaphoretic after getting morphine.  This has resolved at this time.  I reevaluated patient and he states he feels back to baseline.  He is still having  continued pain in his hip.   [EH]  1439 Spoke with hospitalist for admission   [EH]    Clinical Course User Index [EH] Lorin Glass, PA-C        Patient presents today for evaluation of left hip pain worsening over the past 3 to 4 days.  He is also had slight feelings of being lightheaded and generally unwell.  On exam he has significant pain with any major motion of the left hip, even nonweightbearing.  Does not have midline L-spine tenderness step-offs, or deformities and denies any recent trauma.  He is immunosuppressed with prednisone, did not take his prednisone today.  Plain x-rays were obtained without evidence of fracture or other acute abnormality.  Ortho PA request MRI and getting radiology to perform an arthrocentesis.  ESR and CRP were ordered.  ESR is 29, back in June patient's ESR was 28 which I suspect is roughly his baseline.  Does not have a significant leukocytosis.  Attempted to treat his pain with morphine however he became hypotensive and diaphoretic after getting 2 mg of morphine.  This was then added to his allergy list.  His hypotension and diaphoresis rapidly resolved.  Coronavirus testing is negative.    As we are unable to get both MRI and hip arthrocentesis done today will admit patient for arthrocentesis.  Orthopedics requests no antibiotics and for patient to be n.p.o. at midnight.  Patient does report that the feeling lightheaded and generally unwell is similar to in the past when his prednisone dose has been decreased.  He went from 6 mg a day to 5 mg a day 10 days ago.  All this is one possible cause of his symptoms, in the setting of a painful joint raises increased concern for septic arthritis.  This patient was seen as a shared visit with Dr. Roslynn Amble.   I spoke with Dr. Tamala Julian who will admit the patient.    Final Clinical Impressions(s) / ED Diagnoses   Final diagnoses:  Septic arthritis ([place])  Septic arthritis of hip [facility])    ED Discharge  Orders    None       Lorin Glass, [place] 05/04/19 1606    Lucrezia Starch, MD 05/05/19 [phone]

## 2019-05-04 NOTE — ED Notes (Signed)
ED Provider at bedside. 

## 2019-05-04 NOTE — ED Notes (Signed)
Patient transported to MRI. Wife at beside in room.

## 2019-05-04 NOTE — ED Notes (Signed)
Patient transported to X-ray 

## 2019-05-05 ENCOUNTER — Observation Stay (HOSPITAL_COMMUNITY): Payer: Medicare Other

## 2019-05-05 DIAGNOSIS — T402X5A Adverse effect of other opioids, initial encounter: Secondary | ICD-10-CM | POA: Diagnosis present

## 2019-05-05 DIAGNOSIS — M009 Pyogenic arthritis, unspecified: Secondary | ICD-10-CM | POA: Diagnosis present

## 2019-05-05 DIAGNOSIS — M25552 Pain in left hip: Secondary | ICD-10-CM | POA: Diagnosis not present

## 2019-05-05 DIAGNOSIS — Z823 Family history of stroke: Secondary | ICD-10-CM | POA: Diagnosis not present

## 2019-05-05 DIAGNOSIS — C61 Malignant neoplasm of prostate: Secondary | ICD-10-CM | POA: Diagnosis present

## 2019-05-05 DIAGNOSIS — Z20828 Contact with and (suspected) exposure to other viral communicable diseases: Secondary | ICD-10-CM | POA: Diagnosis present

## 2019-05-05 DIAGNOSIS — M316 Other giant cell arteritis: Secondary | ICD-10-CM | POA: Diagnosis present

## 2019-05-05 DIAGNOSIS — X58XXXA Exposure to other specified factors, initial encounter: Secondary | ICD-10-CM | POA: Diagnosis present

## 2019-05-05 DIAGNOSIS — I4891 Unspecified atrial fibrillation: Secondary | ICD-10-CM | POA: Diagnosis present

## 2019-05-05 DIAGNOSIS — Z7952 Long term (current) use of systemic steroids: Secondary | ICD-10-CM | POA: Diagnosis not present

## 2019-05-05 DIAGNOSIS — S76012A Strain of muscle, fascia and tendon of left hip, initial encounter: Secondary | ICD-10-CM | POA: Diagnosis present

## 2019-05-05 DIAGNOSIS — N289 Disorder of kidney and ureter, unspecified: Secondary | ICD-10-CM | POA: Diagnosis present

## 2019-05-05 DIAGNOSIS — M6088 Other myositis, other site: Secondary | ICD-10-CM | POA: Diagnosis present

## 2019-05-05 DIAGNOSIS — Z7982 Long term (current) use of aspirin: Secondary | ICD-10-CM | POA: Diagnosis not present

## 2019-05-05 DIAGNOSIS — M25559 Pain in unspecified hip: Secondary | ICD-10-CM | POA: Diagnosis present

## 2019-05-05 DIAGNOSIS — D899 Disorder involving the immune mechanism, unspecified: Secondary | ICD-10-CM | POA: Diagnosis present

## 2019-05-05 DIAGNOSIS — Y92239 Unspecified place in hospital as the place of occurrence of the external cause: Secondary | ICD-10-CM | POA: Diagnosis present

## 2019-05-05 DIAGNOSIS — Z8249 Family history of ischemic heart disease and other diseases of the circulatory system: Secondary | ICD-10-CM | POA: Diagnosis not present

## 2019-05-05 DIAGNOSIS — I952 Hypotension due to drugs: Secondary | ICD-10-CM | POA: Diagnosis present

## 2019-05-05 DIAGNOSIS — H9190 Unspecified hearing loss, unspecified ear: Secondary | ICD-10-CM | POA: Diagnosis present

## 2019-05-05 DIAGNOSIS — Z79899 Other long term (current) drug therapy: Secondary | ICD-10-CM | POA: Diagnosis not present

## 2019-05-05 DIAGNOSIS — R110 Nausea: Secondary | ICD-10-CM | POA: Diagnosis present

## 2019-05-05 DIAGNOSIS — D630 Anemia in neoplastic disease: Secondary | ICD-10-CM | POA: Diagnosis present

## 2019-05-05 LAB — BASIC METABOLIC PANEL
Anion gap: 9 (ref 5–15)
BUN: 21 mg/dL (ref 8–23)
CO2: 27 mmol/L (ref 22–32)
Calcium: 8.6 mg/dL — ABNORMAL LOW (ref 8.9–10.3)
Chloride: 104 mmol/L (ref 98–111)
Creatinine, Ser: 1.37 mg/dL — ABNORMAL HIGH (ref 0.61–1.24)
GFR calc Af Amer: 56 mL/min — ABNORMAL LOW (ref >60)
GFR calc non Af Amer: 48 mL/min — ABNORMAL LOW (ref >60)
Glucose, Bld: 98 mg/dL (ref 70–99)
Potassium: 4 mmol/L (ref 3.5–5.1)
Sodium: 140 mmol/L (ref 135–145)

## 2019-05-05 LAB — CBC
HCT: 34.5 % — ABNORMAL LOW (ref 39.0–52.0)
Hemoglobin: 11.3 g/dL — ABNORMAL LOW (ref 13.0–17.0)
MCH: 31.1 pg (ref 26.0–34.0)
MCHC: 32.8 g/dL (ref 30.0–36.0)
MCV: 95 fL (ref 80.0–100.0)
Platelets: 152 10*3/uL (ref 150–400)
RBC: 3.63 MIL/uL — ABNORMAL LOW (ref 4.22–5.81)
RDW: 14.6 % (ref 11.5–15.5)
WBC: 8 10*3/uL (ref 4.0–10.5)
nRBC: 0 % (ref 0.0–0.2)

## 2019-05-05 LAB — CK: Total CK: 62 U/L (ref 49–397)

## 2019-05-05 MED ORDER — SENNOSIDES-DOCUSATE SODIUM 8.6-50 MG PO TABS
1.0000 | ORAL_TABLET | Freq: Once | ORAL | Status: AC
  Administered 2019-05-05: 1 via ORAL
  Filled 2019-05-05: qty 1

## 2019-05-05 MED ORDER — OXYCODONE HCL 5 MG PO TABS
2.5000 mg | ORAL_TABLET | Freq: Four times a day (QID) | ORAL | Status: DC | PRN
  Administered 2019-05-05: 2.5 mg via ORAL
  Filled 2019-05-05: qty 1

## 2019-05-05 MED ORDER — ACETAMINOPHEN 500 MG PO TABS
1000.0000 mg | ORAL_TABLET | Freq: Three times a day (TID) | ORAL | Status: DC
  Administered 2019-05-05 – 2019-05-06 (×4): 1000 mg via ORAL
  Filled 2019-05-05 (×4): qty 2

## 2019-05-05 MED ORDER — OXYCODONE HCL 5 MG PO TABS
2.5000 mg | ORAL_TABLET | ORAL | Status: DC | PRN
  Administered 2019-05-06: 5 mg via ORAL
  Filled 2019-05-05: qty 1

## 2019-05-05 MED ORDER — NAPROXEN 250 MG PO TABS
500.0000 mg | ORAL_TABLET | Freq: Two times a day (BID) | ORAL | Status: DC
  Administered 2019-05-05 – 2019-05-06 (×2): 500 mg via ORAL
  Filled 2019-05-05 (×4): qty 2

## 2019-05-05 NOTE — Evaluation (Signed)
Physical Therapy Evaluation Patient Details Name: [name] MRN: [id number] DOB: 09/08/1935 Today's Date: 05/05/2019   History of Present Illness  Pt adm with lt hip pain. MRI was obtained and revealed concerns for high-grade muscle strain versus myositis. PMH - temporal arteritis, prostate CA  Clinical Impression  Pt presents to PT with decr pain due to lt hip pain. As pain improves expect mobility will return to baseline. Will follow acutely. If pain continues to persist and interfere with mobility may need OPPT after DC.     Follow Up Recommendations Outpatient PT(if hip pain persists)    Equipment Recommendations  None recommended by PT    Recommendations for Other Services       Precautions / Restrictions Precautions Precautions: None Restrictions Weight Bearing Restrictions: No(Simultaneous filing. User may not have seen previous data.)      Mobility  Bed Mobility               General bed mobility comments: Pt up in chair  Transfers Overall transfer level: Needs assistance Equipment used: Rolling walker (2 wheeled) Transfers: Sit to/from Stand Sit to Stand: Min assist         General transfer comment: Assist to bring hips up. Verbal cues for hand placement  Ambulation/Gait Ambulation/Gait assistance: Min assist;Supervision Gait Distance (Feet): 170 Feet Assistive device: Rolling walker (2 wheeled);None Gait Pattern/deviations: Step-through pattern;Decreased weight shift to left;Decreased stance time - left;Antalgic Gait velocity: decr Gait velocity interpretation: 1.31 - 2.62 ft/sec, indicative of limited community ambulator General Gait Details: Using walker pt only required supervision. Without walker min assist for support  Stairs            Wheelchair Mobility    Modified Rankin (Stroke Patients Only)       Balance Overall balance assessment: No apparent balance deficits (not formally assessed)                                            Pertinent Vitals/Pain Pain Assessment: 0-10 Pain Score: 6  Pain Location: lt hip Pain Descriptors / Indicators: Aching Pain Intervention(s): Limited activity within patient's tolerance;Monitored during session;Repositioned;Ice applied    Home Living Family/patient expects to be discharged to:: Private residence Living Arrangements: Spouse/significant other Available Help at Discharge: Family;Available 24 hours/day Type of Home: House Home Access: Stairs to enter Entrance Stairs-Rails: Right Entrance Stairs-Number of Steps: 3 Home Layout: One level Home Equipment: Walker - 2 wheels      Prior Function Level of Independence: Independent         Comments: very active. Plays golf frequently     Hand Dominance        Extremity/Trunk Assessment   Upper Extremity Assessment Upper Extremity Assessment: Overall WFL for tasks assessed    Lower Extremity Assessment Lower Extremity Assessment: LLE deficits/detail LLE Deficits / Details: limited by hip pain       Communication   Communication: HOH  Cognition Arousal/Alertness: Awake/alert Behavior During Therapy: WFL for tasks assessed/performed Overall Cognitive Status: Within Functional Limits for tasks assessed                                        General Comments      Exercises     Assessment/Plan    PT Assessment Patient needs  continued PT services  PT Problem List Decreased mobility;Pain       PT Treatment Interventions DME instruction;Gait training;Stair training;Functional mobility training;Therapeutic activities;Patient/family education    PT Goals (Current goals can be found in the Care Plan section)  Acute Rehab PT Goals Patient Stated Goal: decr hip pain PT Goal Formulation: With patient Time For Goal Achievement: 05/12/19 Potential to Achieve Goals: Good    Frequency Min 3X/week   Barriers to discharge        Co-evaluation                AM-PAC PT "6 Clicks" Mobility  Outcome Measure Help needed turning from your back to your side while in a flat bed without using bedrails?: None Help needed moving from lying on your back to sitting on the side of a flat bed without using bedrails?: None Help needed moving to and from a bed to a chair (including a wheelchair)?: A Little Help needed standing up from a chair using your arms (e.g., wheelchair or bedside chair)?: A Little Help needed to walk in hospital room?: A Little Help needed climbing 3-5 steps with a railing? : A Little 6 Click Score: 20    End of Session   Activity Tolerance: Patient tolerated treatment well Patient left: in chair;with call bell/phone within reach;with family/visitor present Nurse Communication: Mobility status PT Visit Diagnosis: Pain;Other abnormalities of gait and mobility (R26.89) Pain - Right/Left: Left Pain - part of body: Hip    Time: [id number] PT Time Calculation (min) (ACUTE ONLY): 22 min   Charges:   PT Evaluation $PT Eval Low Complexity: [place] Pager [phone] Office [place] 05/05/2019, 4:38 PM

## 2019-05-05 NOTE — Progress Notes (Signed)
Progress Note    Jeremiah BILLER  Huynh:[id number] DOB: 23-Jul-1936  DOA: 05/04/2019 PCP: Emmaline Kluver, MD    Brief Narrative:    Medical records reviewed and are as summarized below:  [name] is an 83 y.o. male with medical history significant of nonsustained VT, temporal arteritis on chronic steroids, prostate cancer s/p radioactive seed placement, and hearing loss; who presents with complaints of left hip pain for the last 3 to 4 days.  He denies having any recent falls or trauma.  Pain was initially described as soreness in the left hip.  Movement and trying to bear weight on left hip worsens symptoms.  Associated symptoms include some nausea, malaise, lightheadedness, and chills. He had been taking extra strength Tylenol at home without any relief of symptoms. Denies having any significant fevers, chest pain, shortness of breath, cough, abdominal pain, vomiting, diarrhea, or dysuria symptoms.  Patient is on methotrexate and prednisone for treatment of his temporal arteritis.  It was initially diagnosed approximately 8 years ago and has been on prednisone since that time.  His prednisone dose was just recently decreased from 6 mg down to 5 mg on 9/1.    Assessment/Plan:   Principal Problem:   Left hip pain Active Problems:   Temporal arteritis (HCC)   Immunosuppressed status (HCC)   Nausea   Normocytic anemia   History of prostate cancer  Left hip pain: Acute.  Patient presents with left hip pain and difficulty bearing weight on the affected side.  ESR 29 and CRP 7.  MRI was able to be obtained and revealed concerns for high-grade muscle strain versus myositis.  Septic arthritis appears less likely.  -CK negative -Oxycodone PRN pain- will reduce dose as O2 saturation dropped last PM after midnight dose of pain medication -Physical therapy to evaluate and treat -Dr. Marlou Sa of orthopedics consulted: I think this is something that I would watch for several days to make  sure it does not worsen.  If it does worsen and no other source can be found then consideration for posterior surgery could be made but in my experience with these findings highly unlikely to find any type of fluid pocket only muscle edema.  Renal insufficiency: Patient's baseline creatinine previously noted to be around 1.25, but presents with creatinine elevated up to 1.42 with BUN 27. Cr stable  Temporal arteritis on chronic immunosuppressants: Patient on chronic steroids and methotrexate. -dx 2014 -Continue prednisone -Restart methotrexate at discharge  Nausea: Acute.  Unclear cause of nausea. -resolved  Normocytic anemia: Stable.  Hemoglobin 12. -Hgb stable  Prostate cancer, status post radioactive seed placement   Family Communication/Anticipated D/C date and plan/Code Status   DVT prophylaxis: Lovenox ordered. Code Status: Full Code.  Family Communication: wife at bedside Disposition Plan: pending improvement-required close monitoring of symptoms so real-time decisions can be made regarding plan as diagnosis is not clear   Medical Consultants:   ortho    Subjective:   Hip pain today similar to last PM Very active-- plays golf several times/week  Objective:    Vitals:   05/05/19 0700 05/05/19 0800 05/05/19 0900 05/05/19 1000  BP: 119/62 112/63 118/70 111/77  Pulse: 77 75 75 72  Resp: 13 17 15 17   Temp: 98.9 F (37.2 C)     TempSrc: Oral     SpO2: 98% 97% 99% 98%  Weight:      Height:        Intake/Output Summary (Last 24 hours) at  05/05/2019 1052 Last data filed at 05/05/2019 1000 Gross per 24 hour  Intake 1493.74 ml  Output 300 ml  Net 1193.74 ml   Filed Weights   05/04/19 1700  Weight: 78 kg    Exam: In chair, on 2L [place] rrr No increased work of breathing, lungs clear No LE edema -no rashes  Data Reviewed:   I have personally reviewed following labs and imaging studies:  Labs: Labs show the following:   Basic Metabolic  Panel: Recent Labs  Lab 05/04/19 0902 05/05/19 0252  NA 140 140  K 3.7 4.0  CL 104 104  CO2 25 27  GLUCOSE 104* 98  BUN 27* 21  CREATININE 1.42* 1.37*  CALCIUM 8.7* 8.6*   GFR Estimated Creatinine Clearance: 39.5 mL/min (A) (by C-G formula based on SCr of 1.37 mg/dL (H)). Liver Function Tests: Recent Labs  Lab 05/04/19 0902  AST 19  ALT 15  ALKPHOS 49  BILITOT 0.7  PROT 6.1*  ALBUMIN 3.5   Recent Labs  Lab 05/04/19 0902  LIPASE 26   No results for input(s): AMMONIA in the last 168 hours. Coagulation profile No results for input(s): INR, PROTIME in the last 168 hours.  CBC: Recent Labs  Lab 05/04/19 0902 05/05/19 0252  WBC 8.8 8.0  NEUTROABS 6.8  --   HGB 12.0* 11.3*  HCT 37.0* 34.5*  MCV 96.4 95.0  PLT 171 152   Cardiac Enzymes: Recent Labs  Lab 05/05/19 0252  CKTOTAL 62   BNP (last 3 results) No results for input(s): PROBNP in the last 8760 hours. CBG: No results for input(s): GLUCAP in the last 168 hours. D-Dimer: No results for input(s): DDIMER in the last 72 hours. Hgb A1c: No results for input(s): HGBA1C in the last 72 hours. Lipid Profile: No results for input(s): CHOL, HDL, LDLCALC, TRIG, CHOLHDL, LDLDIRECT in the last 72 hours. Thyroid function studies: No results for input(s): TSH, T4TOTAL, T3FREE, THYROIDAB in the last 72 hours.  Invalid input(s): FREET3 Anemia work up: No results for input(s): VITAMINB12, FOLATE, FERRITIN, TIBC, IRON, RETICCTPCT in the last 72 hours. Sepsis Labs: Recent Labs  Lab 05/04/19 0902 05/05/19 0252  WBC 8.8 8.0    Microbiology Recent Results (from the past 240 hour(s))  SARS Coronavirus 2 [facility] order, Performed in [facility] hospital lab) Nasopharyngeal Nasopharyngeal Swab     Status: None   Collection Time: 05/04/19 11:46 AM   Specimen: Nasopharyngeal Swab  Result Value Ref Range Status   SARS Coronavirus 2 NEGATIVE NEGATIVE Final    Comment: (NOTE) If result is NEGATIVE SARS-CoV-2 target  nucleic acids are NOT DETECTED. The SARS-CoV-2 RNA is generally detectable in upper and lower  respiratory specimens during the acute phase of infection. The lowest  concentration of SARS-CoV-2 viral copies this assay can detect is 250  copies / mL. A negative result does not preclude SARS-CoV-2 infection  and should not be used as the sole basis for treatment or other  patient management decisions.  A negative result may occur with  improper specimen collection / handling, submission of specimen other  than nasopharyngeal swab, presence of viral mutation(s) within the  areas targeted by this assay, and inadequate number of viral copies  (<250 copies / mL). A negative result must be combined with clinical  observations, patient history, and epidemiological information. If result is POSITIVE SARS-CoV-2 target nucleic acids are DETECTED. The SARS-CoV-2 RNA is generally detectable in upper and lower  respiratory specimens dur ing the acute phase of  infection.  Positive  results are indicative of active infection with SARS-CoV-2.  Clinical  correlation with patient history and other diagnostic information is  necessary to determine patient infection status.  Positive results do  not rule out bacterial infection or co-infection with other viruses. If result is PRESUMPTIVE POSTIVE SARS-CoV-2 nucleic acids MAY BE PRESENT.   A presumptive positive result was obtained on the submitted specimen  and confirmed on repeat testing.  While 2019 novel coronavirus  (SARS-CoV-2) nucleic acids may be present in the submitted sample  additional confirmatory testing may be necessary for epidemiological  and / or clinical management purposes  to differentiate between  SARS-CoV-2 and other Sarbecovirus currently known to infect humans.  If clinically indicated additional testing with an alternate test  methodology [phone]) is advised. The SARS-CoV-2 RNA is generally  detectable in upper and lower  respiratory sp ecimens during the acute  phase of infection. The expected result is Negative. Fact Sheet for Patients:  StrictlyIdeas.no Fact Sheet for Healthcare Providers: BankingDealers.co.za This test is not yet approved or cleared by the [place] FDA and has been authorized for detection and/or diagnosis of SARS-CoV-2 by FDA under an Emergency Use Authorization (EUA).  This EUA will remain in effect (meaning this test can be used) for the duration of the COVID-19 declaration under Section 564(b)(1) of the Act, 21 U.S.C. section 360bbb-3(b)(1), unless the authorization is terminated or revoked sooner. Performed at [facility], [place] [address] [place], [place] [address]   MRSA PCR Screening     Status: None   Collection Time: 05/04/19  5:03 PM   Specimen: Nasal Mucosa; Nasopharyngeal  Result Value Ref Range Status   MRSA by PCR NEGATIVE NEGATIVE Final    Comment:        The GeneXpert MRSA Assay (FDA approved for NASAL specimens only), is one component of a comprehensive MRSA colonization surveillance program. It is not intended to diagnose MRSA infection nor to guide or monitor treatment for MRSA infections. Performed at [facility], [place] [address] [place], [place] [address]     Procedures and diagnostic studies:  Mr Hip Left W Wo Contrast  Result Date: 05/04/2019 CLINICAL DATA:  Left hip pain and swelling EXAM: MRI OF THE LEFT HIP WITHOUT AND WITH CONTRAST TECHNIQUE: Multiplanar, multisequence MR imaging was performed both before and after administration of intravenous contrast. CONTRAST:  2m GADAVIST GADOBUTROL 1 MMOL/ML IV SOLN COMPARISON:  X-ray 05/04/2019.  CT 07/18/2018 FINDINGS: Bones: No acute fracture. No dislocation. No femoral head AVN. No bone marrow edema, cortical destruction, or focal bone lesion. Mild discogenic endplate marrow changes at L5-S1. Articular cartilage and labrum Articular  cartilage: Cartilage thinning and surface irregularity of the superior acetabulum. Labrum:  Anterosuperior labral degeneration and complex tearing. Joint or bursal effusion Joint effusion: Trace bilateral hip joint effusions. No pericapsular edema or enhancement. Bursae: Trace left iliopsoas bursal fluid. Minimal bilateral peritrochanteric bursal edema. Muscles and tendons Muscles and tendons: Extensive intramuscular edema like signal within the left pectineus, adductor longus, and adductor magnus muscle bellies. No well-defined intramuscular fluid collections or areas of non enhancement to suggest intramuscular abscess or myonecrosis. Other findings Miscellaneous: Fusiform enlargement of the distal abdominal aorta measuring 2.5 cm in diameter. IMPRESSION: 1. Trace bilateral hip joint effusions. No pericapsular edema or enhancement to suggest septic arthritis. However, if high clinical suspicion remains, consider arthrocentesis. 2. Extensive intramuscular edema like signal within the left pectineus, adductor longus, and adductor magnus muscle bellies. Findings are  nonspecific but could reflect myositis versus a high-grade muscle strain. No well-defined intramuscular fluid collections or areas of non-enhancement to suggest pyomyositis/intramuscular abscess or myonecrosis. Electronically Signed   By: Davina Poke M.D.   On: 05/04/2019 16:17   Dg Hip Unilat With Pelvis 2-3 Views Left  Result Date: 05/04/2019 CLINICAL DATA:  Left hip pain EXAM: DG HIP (WITH OR WITHOUT PELVIS) 2-3V LEFT COMPARISON:  CT pelvis 07/18/2018 FINDINGS: Brachytherapy seed implants projecting over the prostate gland. Preserved articular space in the left hip. No appreciable fracture or acute bony findings. No compelling evidence of bony metastatic disease involving the left hip. IMPRESSION: 1. No conventional radiographic findings to explain the patient's left hip pain over the last 4 days. 2. Prostate seed implants noted. Electronically  Signed   By: Van Clines M.D.   On: 05/04/2019 09:55    Medications:   . aspirin EC  81 mg Oral Daily  . Chlorhexidine Gluconate Cloth  6 each Topical Daily  . enoxaparin (LOVENOX) injection  40 mg Subcutaneous Q24H  . folic acid  1 mg Oral Daily  . pantoprazole  40 mg Oral Daily  . predniSONE  5 mg Oral Q breakfast  . sodium chloride flush  3 mL Intravenous Q12H   Continuous Infusions: . sodium chloride 75 mL/hr at 05/05/19 1000     LOS: 0 days   Geradine Girt  [facility]   How to contact the [facility] Attending or Consulting provider [place] or covering provider during after hours [place], for this patient?  1. Check the care team in [facility] and look for a) attending/consulting [facility] provider listed and b) the [facility] team listed 2. Log into [email or web address] and use [place]'s universal password to access. If you do not have the password, please contact the hospital operator. 3. Locate the [facility] provider you are looking for under [facility] and page to a number that you can be directly reached. 4. If you still have difficulty reaching the provider, please page the [facility] (Director on Call) for the Hospitalists listed on amion for assistance.  05/05/2019, 10:52 AM

## 2019-05-05 NOTE — Progress Notes (Signed)
Patient ID: [name], male   DOB: [birth year], 83 y.o.   MRN: [id number]   LOS: 0 days   Subjective: Left hip pain about the same as yesterday, rates 8/10.   Objective: Vital signs in last 24 hours: Temp:  [98.1 F (36.7 C)-100.6 F (38.1 C)] 98.1 F (36.7 C) (09/11 1100) Pulse Rate:  [55-97] 89 (09/11 1100) Resp:  [10-24] 17 (09/11 1100) BP: (100-143)/(53-77) 105/59 (09/11 1100) SpO2:  [85 %-100 %] 97 % (09/11 1100) Weight:  [78 kg] 78 kg (09/10 1700) Last BM Date: 05/04/19   Laboratory  CBC Recent Labs    05/04/19 0902 05/05/19 0252  WBC 8.8 8.0  HGB 12.0* 11.3*  HCT 37.0* 34.5*  PLT 171 152   BMET Recent Labs    05/04/19 0902 05/05/19 0252  NA 140 140  K 3.7 4.0  CL 104 104  CO2 25 27  GLUCOSE 104* 98  BUN 27* 21  CREATININE 1.42* 1.37*  CALCIUM 8.7* 8.6*     Physical Exam General appearance: alert and no distress  LLE: Mod TTP trochanteric region, no gluteal or groin TTP   Assessment/Plan: Left hip pain -- I still don't have a good sense of what's going on here. Will watch closely over the next couple of days, may need repeat imaging, especially if his WBC or fevers go up. Will add NSAID to see if that provides any benefit though in face of Prednisone I kind of doubt it.    Lisette Abu, PA-C Orthopedic Surgery [phone] 05/05/2019

## 2019-05-06 ENCOUNTER — Encounter (HOSPITAL_COMMUNITY): Payer: Self-pay

## 2019-05-06 MED ORDER — NAPROXEN 500 MG PO TABS: 500.0000 mg | ORAL_TABLET | Freq: Two times a day (BID) | ORAL | 0 refills | Status: AC

## 2019-05-06 MED ORDER — OXYCODONE HCL 5 MG PO TABS: 2.5000 mg | ORAL_TABLET | ORAL | 0 refills | Status: AC | PRN

## 2019-05-06 MED ORDER — PANTOPRAZOLE SODIUM 40 MG PO TBEC: 40.0000 mg | DELAYED_RELEASE_TABLET | Freq: Every day | ORAL | 0 refills | Status: AC

## 2019-05-06 MED ORDER — ACETAMINOPHEN 500 MG PO TABS: 1000.0000 mg | ORAL_TABLET | Freq: Three times a day (TID) | ORAL | 0 refills | Status: AC

## 2019-05-06 NOTE — Discharge Instructions (Signed)
Arthritis Arthritis is a term that is commonly used to refer to joint pain or joint disease. There are more than 100 types of arthritis. What are the causes? The most common cause of this condition is wear and tear of a joint. Other causes include:  Gout.  Inflammation of a joint.  An infection of a joint.  Sprains and other injuries near the joint.  A reaction to medicines or drugs, or an allergic reaction. In some cases, the cause may not be known. What are the signs or symptoms? The main symptom of this condition is pain in the joint during movement. Other symptoms include:  Redness, swelling, or stiffness at a joint.  Warmth coming from the joint.  Fever.  Overall feeling of illness. How is this diagnosed? This condition may be diagnosed with a physical exam and tests, including:  Blood tests.  Urine tests.  Imaging tests, such as X-rays, an MRI, or a CT scan. Sometimes, fluid is removed from a joint for testing. How is this treated? This condition may be treated with:  Treatment of the cause, if it is known.  Rest.  Raising (elevating) the joint.  Applying cold or hot packs to the joint.  Medicines to improve symptoms and reduce inflammation.  Injections of a steroid such as cortisone into the joint to help reduce pain and inflammation. Depending on the cause of your arthritis, you may need to make lifestyle changes to reduce stress on your joint. Changes may include:  Exercising more.  Losing weight. Follow these instructions at home: Medicines  Take over-the-counter and prescription medicines only as told by your health care provider.  Do not take aspirin to relieve pain if your health care provider thinks that gout may be causing your pain. Activity  Rest your joint if told by your health care provider. Rest is important when your disease is active and your joint feels painful, swollen, or stiff.  Avoid activities that make the pain worse. It is  important to balance activity with rest.  Exercise your joint regularly with range-of-motion exercises as told by your health care provider. Try doing low-impact exercise, such as: ? Swimming. ? Water aerobics. ? Biking. ? Walking. Managing pain, stiffness, and swelling      If directed, put ice on the joint. ? Put ice in a plastic bag. ? Place a towel between your skin and the bag. ? Leave the ice on for 20 minutes, 2-3 times per day.  If your joint is swollen, raise (elevate) it above the level of your heart if directed by your health care provider.  If your joint feels stiff in the morning, try taking a warm shower.  If directed, apply heat to the affected area as often as told by your health care provider. Use the heat source that your health care provider recommends, such as a moist heat pack or a heating pad. If you have diabetes, do not apply heat without permission from your health care provider. To apply heat: ? Place a towel between your skin and the heat source. ? Leave the heat on for 20-30 minutes. ? Remove the heat if your skin turns bright red. This is especially important if you are unable to feel pain, heat, or cold. You may have a greater risk of getting burned. General instructions  Do not use any products that contain nicotine or tobacco, such as cigarettes, e-cigarettes, and chewing tobacco. If you need help quitting, ask your health care provider.  Keep  all follow-up visits as told by your health care provider. This is important. Contact a health care provider if:  The pain gets worse.  You have a fever. Get help right away if:  You develop severe joint pain, swelling, or redness.  Many joints become painful and swollen.  You develop severe back pain.  You develop severe weakness in your leg.  You cannot control your bladder or bowels. Summary  Arthritis is a term that is commonly used to refer to joint pain or joint disease. There are more than  100 types of arthritis.  The most common cause of this condition is wear and tear of a joint. Other causes include gout, inflammation or infection of the joint, sprains, or allergies.  Symptoms of this condition include redness, swelling, or stiffness of the joint. Other symptoms include warmth, fever, or feeling ill.  This condition is treated with rest, elevation, medicines, and applying cold or hot packs.  Follow your health care provider's instructions about medicines, activity, exercises, and other home care treatments. This information is not intended to replace advice given to you by your health care provider. Make sure you discuss any questions you have with your health care provider. Document Released: 09/17/2004 Document Revised: 07/18/2018 Document Reviewed: 07/18/2018 Elsevier Patient Education  [employer].   Ankylosing Spondylitis, Adult  Ankylosing spondylitis (AS) is a long-term (chronic) condition that causes swelling and irritation (inflammation), often in the spine. Over time, the inflammation can make new bone form in the spine. This can result in the spinal joints fusing together (ankylosis) and loss of movement. In some people, inflammation may affect other areas of the body, such as the shoulders, hips, ribs, and small joints in the hands and feet. Sometimes the eyes are affected. Inflammation can also happen in the joints of the back and in the tissues that connect joints to bones (ligaments) or connect muscles to bones (tendons). As the disease gets worse, the joints that connect the spine to the pelvis (sacroiliac or SIjoints) are often affected. The condition can range from mild to severe. You may have more severe AS if you smoke. What are the causes? The exact cause of this condition is not known. It may be caused by abnormal genes that are passed down through families. The most common gene that has been linked to AS produces a protein called HLA-B27. Even if you  have genes for AS, they may need to be triggered to become active. Infection may be one trigger. What increases the risk? You are more likely to develop this condition if:  You have a family history of AS.  You are between the ages of 55 and 13.  You are male.  You have frequent gastrointestinal infections. What are the signs or symptoms? The most common symptoms are pain and stiffness that get worse with rest and better with movement. Pain may be worse at night, and stiffness may be worse in the morning. Symptoms also depend on where the inflammation occurs:  Inflammation of the SI joints causes pain in the lower back and buttocks. You may also feel pain in your hips.  Inflammation in the upper spine, neck, and ribs causes pain in those areas. Rib inflammation may cause shortness of breath.  Inflammation in the shoulder, fingers, knees, ankles, toes, or heels may cause pain and stiffness in those areas.  Inflammation in the eyes may cause eye pain, redness, or visual disturbances.  Generalized inflammation may cause fever, loss of appetite,  and fatigue. How is this diagnosed? This condition may be diagnosed based on:  Your symptoms and your medical and family history.  A physical exam.  Tests, such as: ? X-rays or an MRI to look for joint changes or inflammation. ? A blood test to check for the protein HLA-B27. You may be referred to a health care provider who specializes in diseases of the bones, muscles, and joints (rheumatologist). How is this treated? There is no cure for this condition, but treatment can reduce symptoms. Treatment options may include:  Medicines, such as: ? Nonsteroidal anti-inflammatory drugs (NSAIDs). These may be used first to reduce pain and inflammation. ? Steroid shots into inflamed joints to help reverse inflammation. ? Disease-modifying antirheumatic drugs (DMARDs). These may reduce symptoms and slow the progression of the disease. ? Biologic  medicines. These may be used for moderate to severe forms of AS and when other treatments are not helping. These medicines are the most effective but may increase the risk for a serious infection.  Physical therapy and physical activity to help keep muscles strong and keep joints from getting stiff.  Surgery may be needed for severe joint damage, usually in the hip. This may require hip replacement surgery. Follow these instructions at home: Activity  Return to your normal activities as told by your health care provider. Ask your health care provider what activities are safe for you.  Try to get exercise every day. Exercise is very important when you have AS. If you have learned physical therapy exercises, practice them as told by your physical therapist.  Take care to avoid falls. When appropriate, use a cane or walker. Remove area rugs from your home. General instructions  Take over-the-counter and prescription medicines only as told by your health care provider.  Maintain good posture when standing and sitting.  Maintain a healthy weight.  Wear shoes that are supportive and well fitting.  Do not use any products that contain nicotine or tobacco, such as cigarettes and e-cigarettes. These can make your condition worse. If you need help quitting, ask your health care provider.  Keep all follow-up visits as told by your health care provider. This is important. Where to find more information Learn as much as you can about AS. You can find support and information from the [employer]ond[employer] [place]: www.spondylitis.org Contact a health care provider if:  You are on a biologic medicine and you have a fever or any other signs of infection.  You have side effects from any of your medicines.  Your symptoms are not improving with medicine or are getting worse. Get help right away if:  You have trouble breathing.  You have a sudden change in vision. Summary  Ankylosing  spondylitis (AS) is a long-term condition that causes inflammation, often in the spine.  AS may be caused by abnormal genes that can be passed down through families.  The most common symptoms of AS are pain and stiffness that get worse with rest and better with movement.  There is no cure for AS, but treatment can control symptoms and slow progression of the disease.  Nonsteroidal anti-inflammatory drugs (NSAIDs) may be the first treatment used. These help reduce pain and inflammation. This information is not intended to replace advice given to you by your health care provider. Make sure you discuss any questions you have with your health care provider. Document Released: 10/13/2017 Document Revised: 10/13/2017 Document Reviewed: 10/13/2017 Elsevier Patient Education  2020 [employer].

## 2019-05-06 NOTE — Progress Notes (Signed)
Orthopedics Patient with some discomfort internal/external rotation of left hip.  He has no tenderness posterior to the trochanter.  Patient states his pain is improved over the last 2 days.  I reviewed the MRI scan and agree with Dr. Randel Pigg  Assessment.

## 2019-05-06 NOTE — Plan of Care (Signed)
  Problem: Education: Goal: Knowledge of General Education information will improve Description: Including pain rating scale, medication(s)/side effects and non-pharmacologic comfort measures Outcome: Completed/Met   Problem: Health Behavior/Discharge Planning: Goal: Ability to manage health-related needs will improve Outcome: Completed/Met   Problem: Clinical Measurements: Goal: Ability to maintain clinical measurements within normal limits will improve Outcome: Completed/Met Goal: Will remain free from infection Outcome: Completed/Met Goal: Diagnostic test results will improve Outcome: Completed/Met Goal: Respiratory complications will improve Outcome: Completed/Met Goal: Cardiovascular complication will be avoided Outcome: Completed/Met   Problem: Activity: Goal: Risk for activity intolerance will decrease Outcome: Completed/Met   Problem: Nutrition: Goal: Adequate nutrition will be maintained Outcome: Completed/Met   Problem: Coping: Goal: Level of anxiety will decrease Outcome: Completed/Met   Problem: Elimination: Goal: Will not experience complications related to bowel motility Outcome: Completed/Met Goal: Will not experience complications related to urinary retention Outcome: Completed/Met   Problem: Pain Managment: Goal: General experience of comfort will improve Outcome: Completed/Met   Problem: Safety: Goal: Ability to remain free from injury will improve Outcome: Completed/Met   Problem: Skin Integrity: Goal: Risk for impaired skin integrity will decrease Outcome: Completed/Met   Problem: Acute Rehab PT Goals(only PT should resolve) Goal: Patient Will Transfer Sit To/From Stand Outcome: Completed/Met Goal: Pt Will Ambulate Outcome: Completed/Met  Discharge instructions reviewed with patient.  These included, but were not limited to, the following:  prescriptions, new medications, pain management, activity recommendations, dietary recommendations,  follow-up appointments, when to call the MD, when to initiate 911 emergency response, opioid use and management of pain via alternative methods, etc.  Comprehension of instructions ascertained via "teach-back" method of education.  Patient discharged to private residence via private vehicle driven by spouse.  Escorted to exit by nurse via wheelchair.

## 2019-05-06 NOTE — Discharge Summary (Signed)
Physician Discharge Summary  [name] RAX:[id number] DOB: 1936-03-05 DOA: 05/04/2019  PCP: Jeremiah Kluver, MD  Admit date: 05/04/2019 Discharge date: 05/06/2019  Admitted From: home Discharge disposition: home   Recommendations for Outpatient Follow-Up:   Follow up with Dr. Marlou Huynh-- ? Need for more imaging Pain control BMP in 1 week  Discharge Diagnosis:   Principal Problem:   Left hip pain Active Problems:   Temporal arteritis (HCC)   Immunosuppressed status (HCC)   Nausea   Normocytic anemia   History of prostate cancer   Hip pain    Discharge Condition: Improved.  Diet recommendation: Low sodium, heart healthy  Wound care: None.  Code status: Full.   History of Present Illness:   [name] is a 83 y.o. male with medical history significant of nonsustained VT, temporal arteritis on chronic steroids, prostate cancer s/p radioactive seed placement, and hearing loss; who presents with complaints of left hip pain for the last 3 to 4 days.  He denies having any recent falls or trauma.  Pain was initially described as soreness in the left hip.  Movement and trying to bear weight on left hip worsens symptoms.  Associated symptoms include some nausea, malaise, lightheadedness, and chills. He had been taking extra strength Tylenol at home without any relief of symptoms. Denies having any significant fevers, chest pain, shortness of breath, cough, abdominal pain, vomiting, diarrhea, or dysuria symptoms.  Patient is on methotrexate and prednisone for treatment of his temporal arteritis.  It was initially diagnosed approximately 8 years ago and has been on prednisone since that time.  His prednisone dose was just recently decreased from 6 mg down to 5 mg on 9/1.  Patient reports having issues with previous decreases in his prednisone.  He had seen Dr. Amil Huynh of rheumatology 2 days ago who had check CRP and ESR and noted that they were elevated.  However, did  not recommend any change in his prednisone dose at that time.   Hospital Course by Problem:   Left hip pain: Acute.Patient presents with left hip pain and difficulty bearing weight on the affected side. ESR 29 and CRP 7. MRI was able to be obtained and revealedconcernsfor high-grade muscle strain versus myositis.Septic arthritis appears less likely.  -CK negative -Oxycodone PRN pain- will reduce dose as O2 saturation dropped last PM after midnight dose of pain medication -Dr. Marlou Huynh of orthopedics consulted: I think this is something that I would watch for several days to make sure it does not worsen. If it does worsen and no other source can be found then consideration for posterior surgery could be made but in my experience with these findings highly unlikely to find any type of fluid pocket only muscle edema. - discussed on day of discharge with Dr. Lorin Huynh- ok to d/c home as pain has improved and ambulating well--- outpatient follow up with Dr. Marlou Huynh  Renal insufficiency: Cr stable -improved with naprosyn so will need close monitoring of his CR as an outpatient  Temporal arteritis on chronic immunosuppressants: Patient on chronic steroids and methotrexate. -dx 2014 -Continue prednisone -Restart methotrexate at discharge  Nausea: Acute.Unclear cause of nausea. -resolved  Normocytic anemia: Stable. Hemoglobin 12. -Hgb stable  Prostate cancer, status post radioactive seed placement     Medical Consultants:      Discharge Exam:   Vitals:   05/06/19 0858 05/06/19 0931  BP: (!) 127/56 (!) 127/53  Pulse: 67 (!) 58  Resp:  16  Temp: (!) 97.4 F (36.3 C) 97.7 F (36.5 C)  SpO2: 97% 96%   Vitals:   05/06/19 0100 05/06/19 0505 05/06/19 0858 05/06/19 0931  BP: (!) 115/52 (!) 121/57 (!) 127/56 (!) 127/53  Pulse: 65 61 67 (!) 58  Resp: 10   16  Temp:  (!) 97.5 F (36.4 C) (!) 97.4 F (36.3 C) 97.7 F (36.5 C)  TempSrc:  Oral Oral Oral  SpO2: (!) 83% 95%  97% 96%  Weight:      Height:        General exam: Appears calm and comfortable.  Walking much better- pain improved  The results of significant diagnostics from this hospitalization (including imaging, microbiology, ancillary and laboratory) are listed below for reference.     Procedures and Diagnostic Studies:   Mr Hip Left W Wo Contrast  Result Date: 05/04/2019 CLINICAL DATA:  Left hip pain and swelling EXAM: MRI OF THE LEFT HIP WITHOUT AND WITH CONTRAST TECHNIQUE: Multiplanar, multisequence MR imaging was performed both before and after administration of intravenous contrast. CONTRAST:  4m GADAVIST GADOBUTROL 1 MMOL/ML IV SOLN COMPARISON:  X-ray 05/04/2019.  CT 07/18/2018 FINDINGS: Bones: No acute fracture. No dislocation. No femoral head AVN. No bone marrow edema, cortical destruction, or focal bone lesion. Mild discogenic endplate marrow changes at L5-S1. Articular cartilage and labrum Articular cartilage: Cartilage thinning and surface irregularity of the superior acetabulum. Labrum:  Anterosuperior labral degeneration and complex tearing. Joint or bursal effusion Joint effusion: Trace bilateral hip joint effusions. No pericapsular edema or enhancement. Bursae: Trace left iliopsoas bursal fluid. Minimal bilateral peritrochanteric bursal edema. Muscles and tendons Muscles and tendons: Extensive intramuscular edema like signal within the left pectineus, adductor longus, and adductor magnus muscle bellies. No well-defined intramuscular fluid collections or areas of non enhancement to suggest intramuscular abscess or myonecrosis. Other findings Miscellaneous: Fusiform enlargement of the distal abdominal aorta measuring 2.5 cm in diameter. IMPRESSION: 1. Trace bilateral hip joint effusions. No pericapsular edema or enhancement to suggest septic arthritis. However, if high clinical suspicion remains, consider arthrocentesis. 2. Extensive intramuscular edema like signal within the left pectineus,  adductor longus, and adductor magnus muscle bellies. Findings are nonspecific but could reflect myositis versus a high-grade muscle strain. No well-defined intramuscular fluid collections or areas of non-enhancement to suggest pyomyositis/intramuscular abscess or myonecrosis. Electronically Signed   By: NDavina PokeM.D.   On: 05/04/2019 16:17   Dg Hip Unilat With Pelvis 2-3 Views Left  Result Date: 05/04/2019 CLINICAL DATA:  Left hip pain EXAM: DG HIP (WITH OR WITHOUT PELVIS) 2-3V LEFT COMPARISON:  CT pelvis 07/18/2018 FINDINGS: Brachytherapy seed implants projecting over the prostate gland. Preserved articular space in the left hip. No appreciable fracture or acute bony findings. No compelling evidence of bony metastatic disease involving the left hip. IMPRESSION: 1. No conventional radiographic findings to explain the patient's left hip pain over the last 4 days. 2. Prostate seed implants noted. Electronically Signed   By: WVan ClinesM.D.   On: 05/04/2019 09:55     Labs:   Basic Metabolic Panel: Recent Labs  Lab 05/04/19 0902 05/05/19 0252  NA 140 140  K 3.7 4.0  CL 104 104  CO2 25 27  GLUCOSE 104* 98  BUN 27* 21  CREATININE 1.42* 1.37*  CALCIUM 8.7* 8.6*   GFR Estimated Creatinine Clearance: 39.5 mL/min (A) (by C-G formula based on SCr of 1.37 mg/dL (H)). Liver Function Tests: Recent Labs  Lab 05/04/19 0902  AST 19  ALT 15  ALKPHOS 49  BILITOT 0.7  PROT 6.1*  ALBUMIN 3.5   Recent Labs  Lab 05/04/19 0902  LIPASE 26   No results for input(s): AMMONIA in the last 168 hours. Coagulation profile No results for input(s): INR, PROTIME in the last 168 hours.  CBC: Recent Labs  Lab 05/04/19 0902 05/05/19 0252  WBC 8.8 8.0  NEUTROABS 6.8  --   HGB 12.0* 11.3*  HCT 37.0* 34.5*  MCV 96.4 95.0  PLT 171 152   Cardiac Enzymes: Recent Labs  Lab 05/05/19 0252  CKTOTAL 62   BNP: Invalid input(s): POCBNP CBG: No results for input(s): GLUCAP in the last  168 hours. D-Dimer No results for input(s): DDIMER in the last 72 hours. Hgb A1c No results for input(s): HGBA1C in the last 72 hours. Lipid Profile No results for input(s): CHOL, HDL, LDLCALC, TRIG, CHOLHDL, LDLDIRECT in the last 72 hours. Thyroid function studies No results for input(s): TSH, T4TOTAL, T3FREE, THYROIDAB in the last 72 hours.  Invalid input(s): FREET3 Anemia work up No results for input(s): VITAMINB12, FOLATE, FERRITIN, TIBC, IRON, RETICCTPCT in the last 72 hours. Microbiology Recent Results (from the past 240 hour(s))  SARS Coronavirus 2 [facility] order, Performed in [facility] hospital lab) Nasopharyngeal Nasopharyngeal Swab     Status: None   Collection Time: 05/04/19 11:46 AM   Specimen: Nasopharyngeal Swab  Result Value Ref Range Status   SARS Coronavirus 2 NEGATIVE NEGATIVE Final    Comment: (NOTE) If result is NEGATIVE SARS-CoV-2 target nucleic acids are NOT DETECTED. The SARS-CoV-2 RNA is generally detectable in upper and lower  respiratory specimens during the acute phase of infection. The lowest  concentration of SARS-CoV-2 viral copies this assay can detect is 250  copies / mL. A negative result does not preclude SARS-CoV-2 infection  and should not be used as the sole basis for treatment or other  patient management decisions.  A negative result may occur with  improper specimen collection / handling, submission of specimen other  than nasopharyngeal swab, presence of viral mutation(s) within the  areas targeted by this assay, and inadequate number of viral copies  (<250 copies / mL). A negative result must be combined with clinical  observations, patient history, and epidemiological information. If result is POSITIVE SARS-CoV-2 target nucleic acids are DETECTED. The SARS-CoV-2 RNA is generally detectable in upper and lower  respiratory specimens dur ing the acute phase of infection.  Positive  results are indicative of active infection with  SARS-CoV-2.  Clinical  correlation with patient history and other diagnostic information is  necessary to determine patient infection status.  Positive results do  not rule out bacterial infection or co-infection with other viruses. If result is PRESUMPTIVE POSTIVE SARS-CoV-2 nucleic acids MAY BE PRESENT.   A presumptive positive result was obtained on the submitted specimen  and confirmed on repeat testing.  While 2019 novel coronavirus  (SARS-CoV-2) nucleic acids may be present in the submitted sample  additional confirmatory testing may be necessary for epidemiological  and / or clinical management purposes  to differentiate between  SARS-CoV-2 and other Sarbecovirus currently known to infect humans.  If clinically indicated additional testing with an alternate test  methodology [phone]) is advised. The SARS-CoV-2 RNA is generally  detectable in upper and lower respiratory sp ecimens during the acute  phase of infection. The expected result is Negative. Fact Sheet for Patients:  StrictlyIdeas.no Fact Sheet for Healthcare Providers: BankingDealers.co.za This test is not yet approved or cleared by the [place]  States FDA and has been authorized for detection and/or diagnosis of SARS-CoV-2 by FDA under an Emergency Use Authorization (EUA).  This EUA will remain in effect (meaning this test can be used) for the duration of the COVID-19 declaration under Section 564(b)(1) of the Act, 21 U.S.C. section 360bbb-3(b)(1), unless the authorization is terminated or revoked sooner. Performed at [facility], [place] [address] [place], [place] [address]   MRSA PCR Screening     Status: None   Collection Time: 05/04/19  5:03 PM   Specimen: Nasal Mucosa; Nasopharyngeal  Result Value Ref Range Status   MRSA by PCR NEGATIVE NEGATIVE Final    Comment:        The GeneXpert MRSA Assay (FDA approved for NASAL specimens only), is one component of  a comprehensive MRSA colonization surveillance program. It is not intended to diagnose MRSA infection nor to guide or monitor treatment for MRSA infections. Performed at [facility], [place] [address] [place], [place] [address]      Discharge Instructions:   Discharge Instructions    Diet - low sodium heart healthy   Complete by: As directed    Discharge instructions   Complete by: As directed    For the next 4 days would take tylenol and naprosyn scheduled and then change to as needed depending on your pain and Dr. Marlou Huynh follow up -while on naprosyn also take protonix to protect your stomach lining   Increase activity slowly   Complete by: As directed      Allergies as of 05/06/2019      Reactions   Metoprolol Swelling   swelling of legs   Morphine And Related Other (See Comments)   Diaphoretic and hypotensive      Medication List    TAKE these medications   acetaminophen 500 MG tablet Commonly known as: TYLENOL Take 2 tablets (1,000 mg total) by mouth 3 (three) times daily for 3 days.   aspirin 81 MG tablet Take 81 mg by mouth daily. One tablet   B Complex 50 Tabs Take 1 tablet by mouth daily.   CALTRATE 600+D PO Take 1 tablet by mouth 2 (two) times daily.   cetirizine 10 MG tablet Commonly known as: ZYRTEC Take 10 mg by mouth daily.   cholecalciferol 1000 units tablet Commonly known as: VITAMIN D Take 800 Units by mouth 2 (two) times daily.   folic acid 1 MG tablet Commonly known as: FOLVITE Take 1 mg by mouth daily.   methotrexate 2.5 MG tablet Commonly known as: RHEUMATREX Take 10 mg by mouth once a week. Friday   naproxen 500 MG tablet Commonly known as: NAPROSYN Take 1 tablet (500 mg total) by mouth 2 (two) times daily with a meal for 3 days.   oxyCODONE 5 MG immediate release tablet Commonly known as: Oxy IR/ROXICODONE Take 0.5-1 tablets (2.5-5 mg total) by mouth every 4 (four) hours as needed for up to 3 days for severe pain.    pantoprazole 40 MG tablet Commonly known as: PROTONIX Take 1 tablet (40 mg total) by mouth daily. Start taking on: May 07, 2019   predniSONE 5 MG tablet Commonly known as: DELTASONE Take 1 tablet (5 mg total) by mouth daily with breakfast. What changed: Another medication with the same name was removed. Continue taking this medication, and follow the directions you see here.   PRESERVISION AREDS 2 PO Take by mouth 2 (two) times daily.      Follow-up Information  Street, Sharon Mt, MD Follow up in 1 week(s).   Specialty: Family Medicine Contact information: [place] [address] [phone]        Meredith Pel, MD. Schedule an appointment as soon as possible for a visit.   Specialty: Orthopedic Surgery Contact information: [place] [address] [phone]            Time coordinating discharge: 35 min  Signed:  Geradine Girt DO  [facility] 05/06/2019, 11:55 AM

## 2019-05-09 DIAGNOSIS — M316 Other giant cell arteritis: Secondary | ICD-10-CM | POA: Diagnosis not present

## 2019-05-09 DIAGNOSIS — M25552 Pain in left hip: Secondary | ICD-10-CM | POA: Diagnosis not present

## 2019-05-10 ENCOUNTER — Ambulatory Visit (INDEPENDENT_AMBULATORY_CARE_PROVIDER_SITE_OTHER): Payer: Medicare Other | Admitting: Orthopedic Surgery

## 2019-05-10 ENCOUNTER — Encounter: Payer: Self-pay | Admitting: Orthopedic Surgery

## 2019-05-10 ENCOUNTER — Other Ambulatory Visit: Payer: Self-pay

## 2019-05-10 DIAGNOSIS — M25552 Pain in left hip: Secondary | ICD-10-CM | POA: Diagnosis not present

## 2019-05-10 NOTE — Progress Notes (Signed)
Office Visit Note   Patient: [name]           Date of Birth: 09/20/1935           MRN: [id number] Visit Date: 05/10/2019 Requested by: Emmaline Kluver, MD [address] [place],  [place] [address] PCP: Venetia Maxon, Sharon Mt, MD  Subjective: Chief Complaint  Patient presents with  . Left Hip - Pain    HPI: [name] is a patient with left hip leg and buttock pain.  I saw him in the hospital for consult.  Pain started about 1 to 2 weeks ago.  He states that his hip is feeling better.  He does have temporal arteritis and takes prednisone.  He has been taking Tylenol only for pain.  Denies any limping or any fevers and chills              ROS: All systems reviewed are negative as they relate to the chief complaint within the history of present illness.  Patient denies  fevers or chills.   Assessment & Plan: Visit Diagnoses:  1. Pain in left hip     Plan: Impression is left hip pain now resolved.  Exam is normal today.  I would say repeat imaging would be required if recurrent pain occurs.  Follow-up as needed  Follow-Up Instructions: No follow-ups on file.   Orders:  No orders of the defined types were placed in this encounter.  No orders of the defined types were placed in this encounter.     Procedures: No procedures performed   Clinical Data: No additional findings.  Objective: Vital Signs: There were no vitals taken for this visit.  Physical Exam:   Constitutional: Patient appears well-developed HEENT:  Head: Normocephalic Eyes:EOM are normal Neck: Normal range of motion Cardiovascular: Normal rate Pulmonary/chest: Effort normal Neurologic: Patient is alert Skin: Skin is warm Psychiatric: Patient has normal mood and affect    Ortho Exam: Ortho exam demonstrates full active and passive range of motion of the hip with no loss of hip flexion abduction adduction strength.  Extensor mechanism is intact.  No tenderness to palpation around the  trochanteric region and no groin pain.  Specialty Comments:  No specialty comments available.  Imaging: No results found.   PMFS History: Patient Active Problem List   Diagnosis Date Noted  . Hip pain 05/05/2019  . Left hip pain 05/04/2019  . Immunosuppressed status ([place]) 05/04/2019  . Nausea 05/04/2019  . Normocytic anemia 05/04/2019  . History of prostate cancer 05/04/2019  . Temporal arteritis ([place]) 12/20/2012   Past Medical History:  Diagnosis Date  . Arthritis    temporal  . Degeneration macular   . Hearing aid worn    Right  . Hx of biopsy    temporal artery  . Hx of tonsillectomy   . Prostate cancer ([place])   . Spondylosis    cervical   . Temporal arteritis ([place]) 12/20/2012    Family History  Problem Relation Age of Onset  . Stroke Sister   . Seizures Sister   . Heart disease Brother     Past Surgical History:  Procedure Laterality Date  . radium seed implants for prostate cancer    . TONSILLECTOMY     Social History   Occupational History  . Occupation: Retired  Tobacco Use  . Smoking status: Never Smoker  . Smokeless tobacco: Never Used  Substance and Sexual Activity  . Alcohol use: No  . Drug use: No  .  Sexual activity: Not on file

## 2019-05-15 DIAGNOSIS — L570 Actinic keratosis: Secondary | ICD-10-CM | POA: Diagnosis not present

## 2019-05-15 DIAGNOSIS — L820 Inflamed seborrheic keratosis: Secondary | ICD-10-CM | POA: Diagnosis not present

## 2019-05-15 DIAGNOSIS — L821 Other seborrheic keratosis: Secondary | ICD-10-CM | POA: Diagnosis not present

## 2019-05-15 DIAGNOSIS — L578 Other skin changes due to chronic exposure to nonionizing radiation: Secondary | ICD-10-CM | POA: Diagnosis not present

## 2019-07-24 DIAGNOSIS — M316 Other giant cell arteritis: Secondary | ICD-10-CM | POA: Diagnosis not present

## 2019-08-01 DIAGNOSIS — M316 Other giant cell arteritis: Secondary | ICD-10-CM | POA: Diagnosis not present

## 2019-08-01 DIAGNOSIS — Z7952 Long term (current) use of systemic steroids: Secondary | ICD-10-CM | POA: Diagnosis not present

## 2019-09-12 DIAGNOSIS — M316 Other giant cell arteritis: Secondary | ICD-10-CM | POA: Diagnosis not present

## 2019-09-22 DIAGNOSIS — Z7952 Long term (current) use of systemic steroids: Secondary | ICD-10-CM | POA: Diagnosis not present

## 2019-09-22 DIAGNOSIS — M316 Other giant cell arteritis: Secondary | ICD-10-CM | POA: Diagnosis not present

## 2019-11-29 DIAGNOSIS — Z7952 Long term (current) use of systemic steroids: Secondary | ICD-10-CM | POA: Diagnosis not present

## 2019-11-29 DIAGNOSIS — H524 Presbyopia: Secondary | ICD-10-CM | POA: Diagnosis not present

## 2019-11-29 DIAGNOSIS — H353131 Nonexudative age-related macular degeneration, bilateral, early dry stage: Secondary | ICD-10-CM | POA: Diagnosis not present

## 2019-11-29 DIAGNOSIS — M316 Other giant cell arteritis: Secondary | ICD-10-CM | POA: Diagnosis not present

## 2020-01-12 DIAGNOSIS — M7552 Bursitis of left shoulder: Secondary | ICD-10-CM | POA: Diagnosis not present

## 2020-01-12 DIAGNOSIS — M19012 Primary osteoarthritis, left shoulder: Secondary | ICD-10-CM | POA: Diagnosis not present

## 2020-01-12 DIAGNOSIS — M19011 Primary osteoarthritis, right shoulder: Secondary | ICD-10-CM | POA: Diagnosis not present

## 2020-01-12 DIAGNOSIS — M12812 Other specific arthropathies, not elsewhere classified, left shoulder: Secondary | ICD-10-CM | POA: Diagnosis not present

## 2020-01-29 DIAGNOSIS — E559 Vitamin D deficiency, unspecified: Secondary | ICD-10-CM | POA: Diagnosis not present

## 2020-01-29 DIAGNOSIS — R739 Hyperglycemia, unspecified: Secondary | ICD-10-CM | POA: Diagnosis not present

## 2020-01-29 DIAGNOSIS — D6489 Other specified anemias: Secondary | ICD-10-CM | POA: Diagnosis not present

## 2020-01-29 DIAGNOSIS — D649 Anemia, unspecified: Secondary | ICD-10-CM | POA: Diagnosis not present

## 2020-01-29 DIAGNOSIS — E785 Hyperlipidemia, unspecified: Secondary | ICD-10-CM | POA: Diagnosis not present

## 2020-02-01 DIAGNOSIS — Z7952 Long term (current) use of systemic steroids: Secondary | ICD-10-CM | POA: Diagnosis not present

## 2020-02-01 DIAGNOSIS — M316 Other giant cell arteritis: Secondary | ICD-10-CM | POA: Diagnosis not present

## 2020-02-16 DIAGNOSIS — M316 Other giant cell arteritis: Secondary | ICD-10-CM | POA: Diagnosis not present

## 2020-02-16 DIAGNOSIS — Z0000 Encounter for general adult medical examination without abnormal findings: Secondary | ICD-10-CM | POA: Diagnosis not present

## 2020-02-16 DIAGNOSIS — E611 Iron deficiency: Secondary | ICD-10-CM | POA: Diagnosis not present

## 2020-02-16 DIAGNOSIS — E785 Hyperlipidemia, unspecified: Secondary | ICD-10-CM | POA: Diagnosis not present

## 2020-02-16 DIAGNOSIS — D6489 Other specified anemias: Secondary | ICD-10-CM | POA: Diagnosis not present

## 2020-02-16 DIAGNOSIS — Z780 Asymptomatic menopausal state: Secondary | ICD-10-CM | POA: Diagnosis not present

## 2020-02-16 DIAGNOSIS — M8589 Other specified disorders of bone density and structure, multiple sites: Secondary | ICD-10-CM | POA: Diagnosis not present

## 2020-02-16 DIAGNOSIS — M8580 Other specified disorders of bone density and structure, unspecified site: Secondary | ICD-10-CM | POA: Diagnosis not present

## 2020-03-25 DIAGNOSIS — M316 Other giant cell arteritis: Secondary | ICD-10-CM | POA: Diagnosis not present

## 2020-03-25 DIAGNOSIS — Z7952 Long term (current) use of systemic steroids: Secondary | ICD-10-CM | POA: Diagnosis not present

## 2020-03-29 DIAGNOSIS — J3489 Other specified disorders of nose and nasal sinuses: Secondary | ICD-10-CM | POA: Diagnosis not present

## 2020-03-29 DIAGNOSIS — Z1152 Encounter for screening for COVID-19: Secondary | ICD-10-CM | POA: Diagnosis not present

## 2020-04-19 ENCOUNTER — Telehealth: Payer: Self-pay | Admitting: Adult Health
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
## 2020-04-22 NOTE — Telephone Encounter (Signed)
Events noted

## 2020-04-24 DEATH — deceased

## 2020-06-05 IMAGING — MR MR HIP*L* WO/W CM
8 series · 40 of 40 positions shown · IV contrast (gadavist)
Comparison: X-ray 05/04/2019.  CT 07/18/2018

CLINICAL DATA: Left hip pain and swelling

EXAM:
MRI OF THE LEFT HIP WITHOUT AND WITH CONTRAST
TECHNIQUE: Multiplanar, multisequence MR imaging was performed both before and
after administration of intravenous contrast.
CONTRAST:  7mL GADAVIST GADOBUTROL 1 MMOL/ML IV SOLN

[Series 12: T1 · coronal · left · 4.0mm · 1.17mm/px · 7 of 40 slices shown]
[im 1/40]
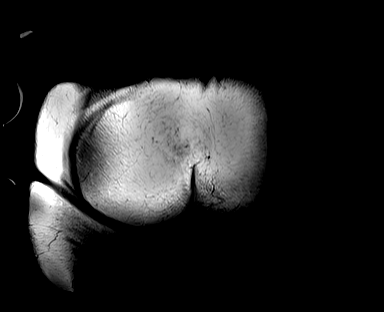
[im 7/40]
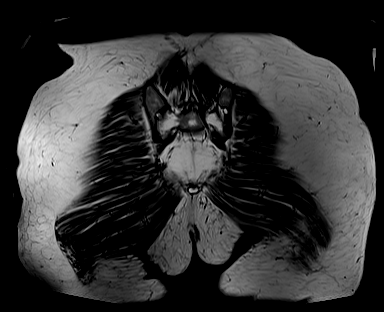
[im 14/40]
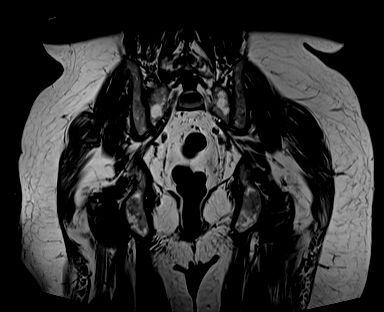
[im 20/40]
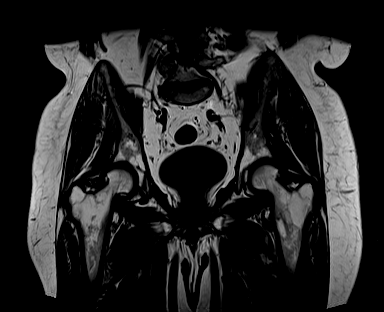
[im 27/40]
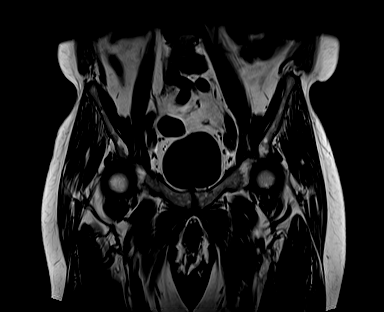
[im 33/40]
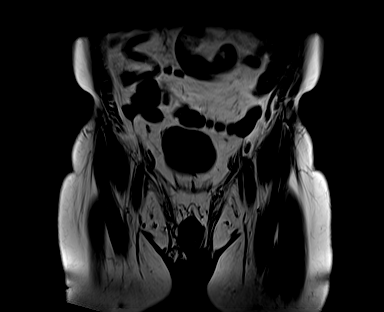
[im 40/40]
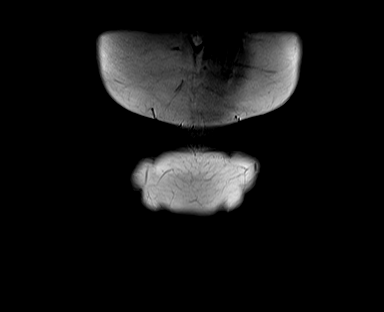

[Series 13: T2 fat-sat · coronal · left · 4.0mm · 1.00mm/px · 6 of 40 slices shown (1 of 2)]
[im 1/40]
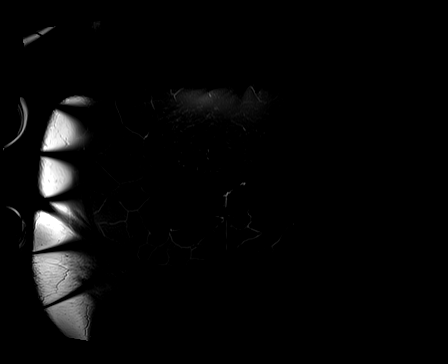
[im 8/40]
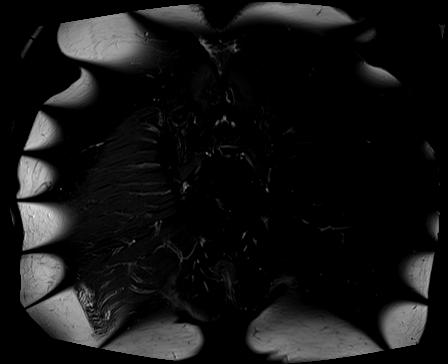
[im 16/40]
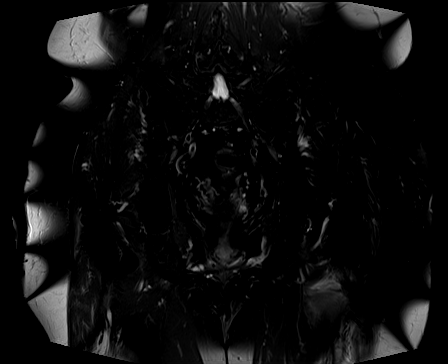
[im 24/40]
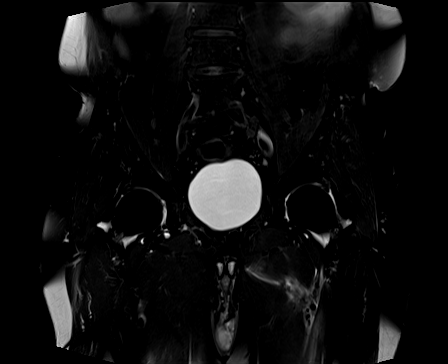
[im 32/40]
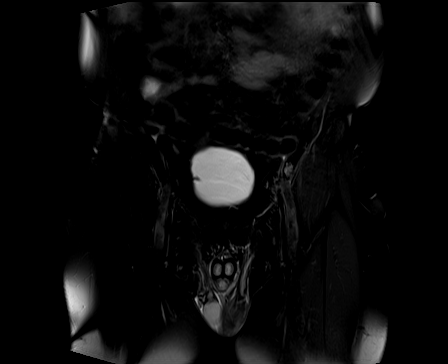
[im 40/40]
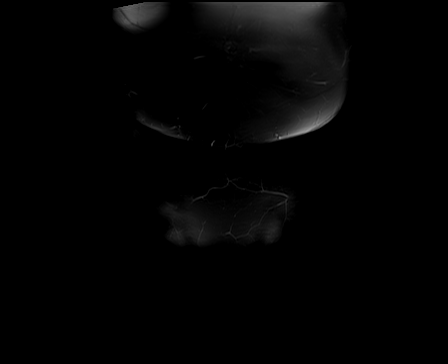

[Series 14: T2 fat-sat · axial · left · 4.0mm · 0.35mm/px · z∈[-196,-26]mm · 5 of 35 slices shown (2 of 2)]
[im 1/35]
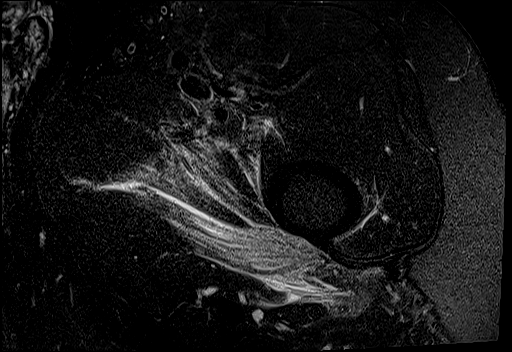
[im 9/35]
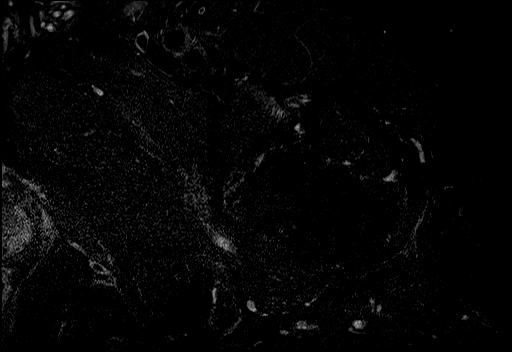
[im 18/35]
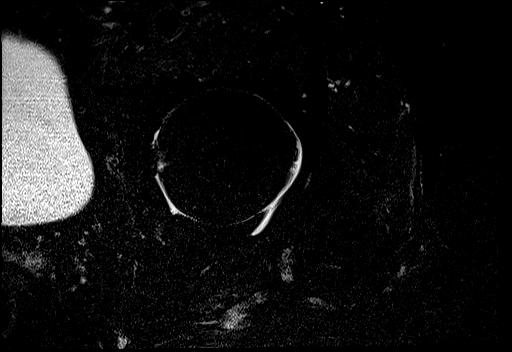
[im 26/35]
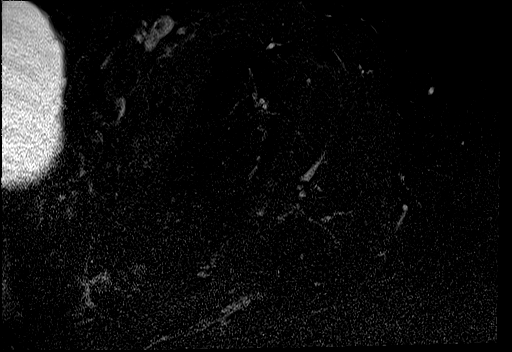
[im 35/35]
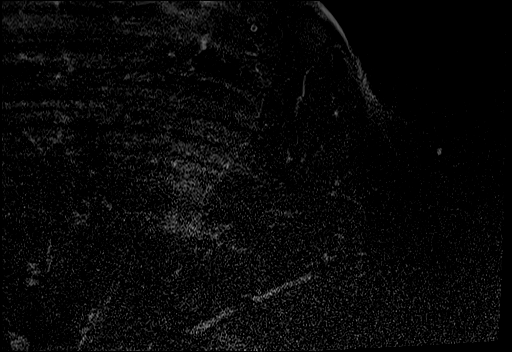

[Series 15: PD fat-sat · sagittal · left · 4.0mm · 0.56mm/px · 4 of 28 slices shown (1 of 2)]
[im 1/28]
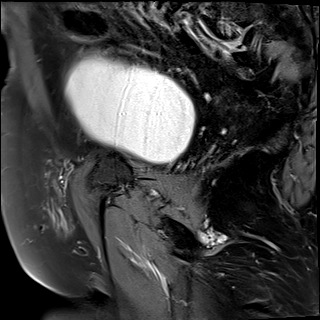
[im 10/28]
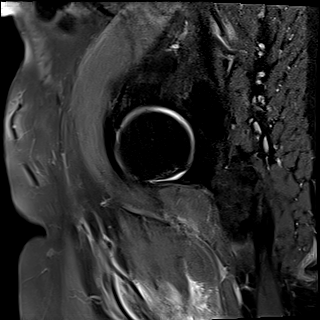
[im 19/28]
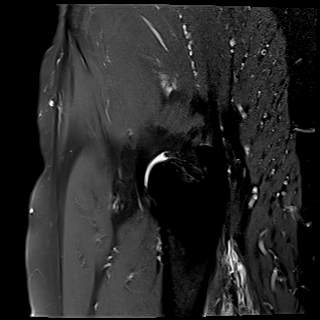
[im 28/28]
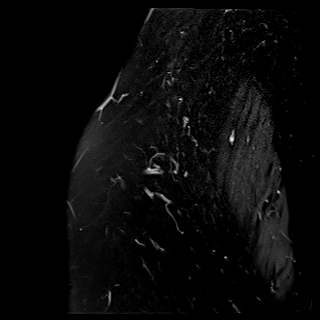

[Series 16: PD fat-sat · coronal · left · 3.0mm · 0.70mm/px · 4 of 30 slices shown (2 of 2)]
[im 1/30]
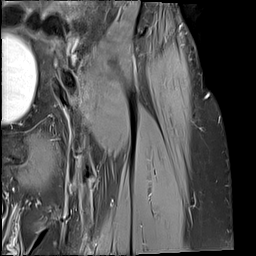
[im 10/30]
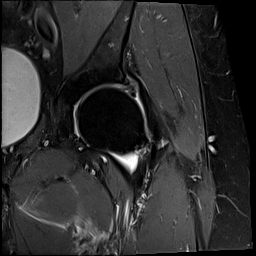
[im 20/30]
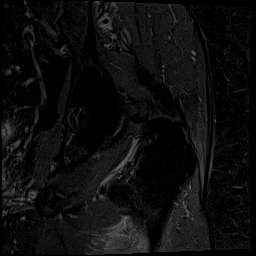
[im 30/30]
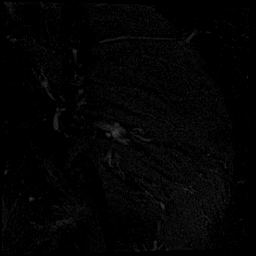

[Series 17: T1 fat-sat · axial · non-contrast · left · 4.0mm · 0.87mm/px · z∈[-196,-26]mm · 5 of 35 slices shown]
[im 1/35]
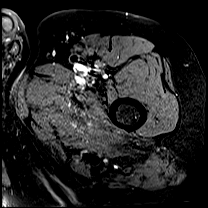
[im 9/35]
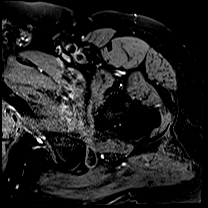
[im 18/35]
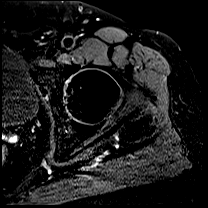
[im 26/35]
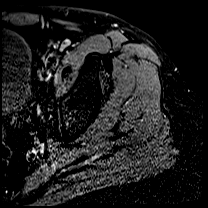
[im 35/35]
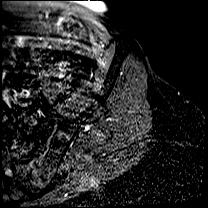

[Series 18: T1 fat-sat post-contrast · axial · left · 4.0mm · 0.87mm/px · z∈[-196,-26]mm · 5 of 35 slices shown (1 of 2)]
[im 1/35]
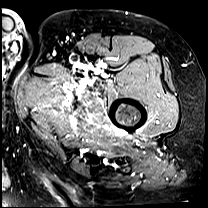
[im 9/35]
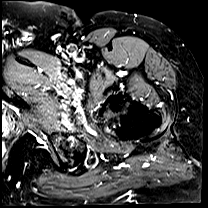
[im 18/35]
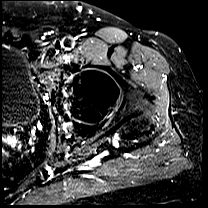
[im 26/35]
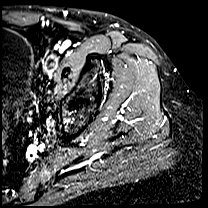
[im 35/35]
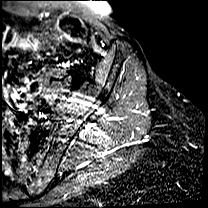

[Series 19: T1 fat-sat post-contrast · coronal · left · 3.0mm · 0.87mm/px · 4 of 30 slices shown (2 of 2)]
[im 1/30]
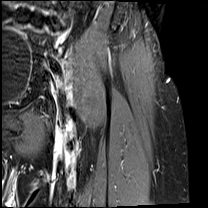
[im 10/30]
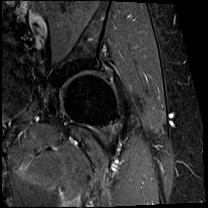
[im 20/30]
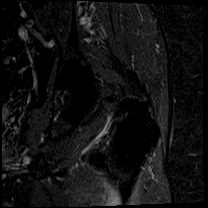
[im 30/30]
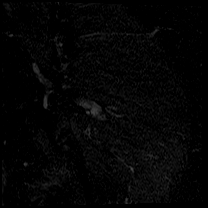

[40 of 40 positions shown; findings below may reference images not displayed]

FINDINGS: Bones: No acute fracture. No dislocation. No femoral head AVN. No
bone marrow edema, cortical destruction, or focal bone lesion. Mild
discogenic endplate marrow changes at L5-S1.

Articular cartilage and labrum

Articular cartilage: Cartilage thinning and surface irregularity of
the superior acetabulum.

Labrum:  Anterosuperior labral degeneration and complex tearing.

Joint or bursal effusion

Joint effusion: Trace bilateral hip joint effusions. No pericapsular
edema or enhancement.

Bursae: Trace left iliopsoas bursal fluid. Minimal bilateral
peritrochanteric bursal edema.

Muscles and tendons

Muscles and tendons: Extensive intramuscular edema like signal
within the left pectineus, adductor longus, and adductor magnus
muscle bellies. No well-defined intramuscular fluid collections or
areas of non enhancement to suggest intramuscular abscess or
myonecrosis.

Other findings

Miscellaneous: Fusiform enlargement of the distal abdominal aorta
measuring 2.5 cm in diameter.
IMPRESSION: 1. Trace bilateral hip joint effusions. No pericapsular edema or
enhancement to suggest septic arthritis. However, if high clinical
suspicion remains, consider arthrocentesis.
2. Extensive intramuscular edema like signal within the left
pectineus, adductor longus, and adductor magnus muscle bellies.
Findings are nonspecific but could reflect myositis versus a
high-grade muscle strain. No well-defined intramuscular fluid
collections or areas of non-enhancement to suggest
pyomyositis/intramuscular abscess or myonecrosis.
